# Patient Record
Sex: Male | Born: 1970 | Race: White | Hispanic: No | Marital: Single | Smoking: Never smoker
Health system: Southern US, Community
[De-identification: ages and names within clinical notes are randomized; demographics above are authoritative.]

## PROBLEM LIST (undated history)

## (undated) DIAGNOSIS — I10 Essential (primary) hypertension: Secondary | ICD-10-CM

## (undated) DIAGNOSIS — N400 Enlarged prostate without lower urinary tract symptoms: Secondary | ICD-10-CM

## (undated) DIAGNOSIS — I517 Cardiomegaly: Secondary | ICD-10-CM

## (undated) HISTORY — PX: ARTERY REPAIR: SHX559

## (undated) HISTORY — PX: IMPACTED THIRD MOLAR REMOVAL: SHX1790

---

## 2002-11-11 HISTORY — PX: OTHER SURGICAL HISTORY: SHX169

## 2006-06-09 ENCOUNTER — Emergency Department: Payer: Self-pay | Admitting: Emergency Medicine

## 2006-06-09 ENCOUNTER — Other Ambulatory Visit: Payer: Self-pay

## 2007-02-06 ENCOUNTER — Emergency Department: Payer: Self-pay | Admitting: Emergency Medicine

## 2007-06-15 IMAGING — CR DG CHEST 1V PORT
1 series · 1 of 1 positions shown · non-contrast
Comparison: none

REASON FOR EXAM: Chest pain           rm 5
COMMENTS:

PROCEDURE:     DXR - DXR PORTABLE CHEST SINGLE VIEW  - June 09, 2006  [DATE]
RESULT:     AP view of the chest shows the lung fields to be clear.  The
heart, mediastinal, and osseous structures are normal in appearance.
Monitoring electrodes are present.

[view not recorded]
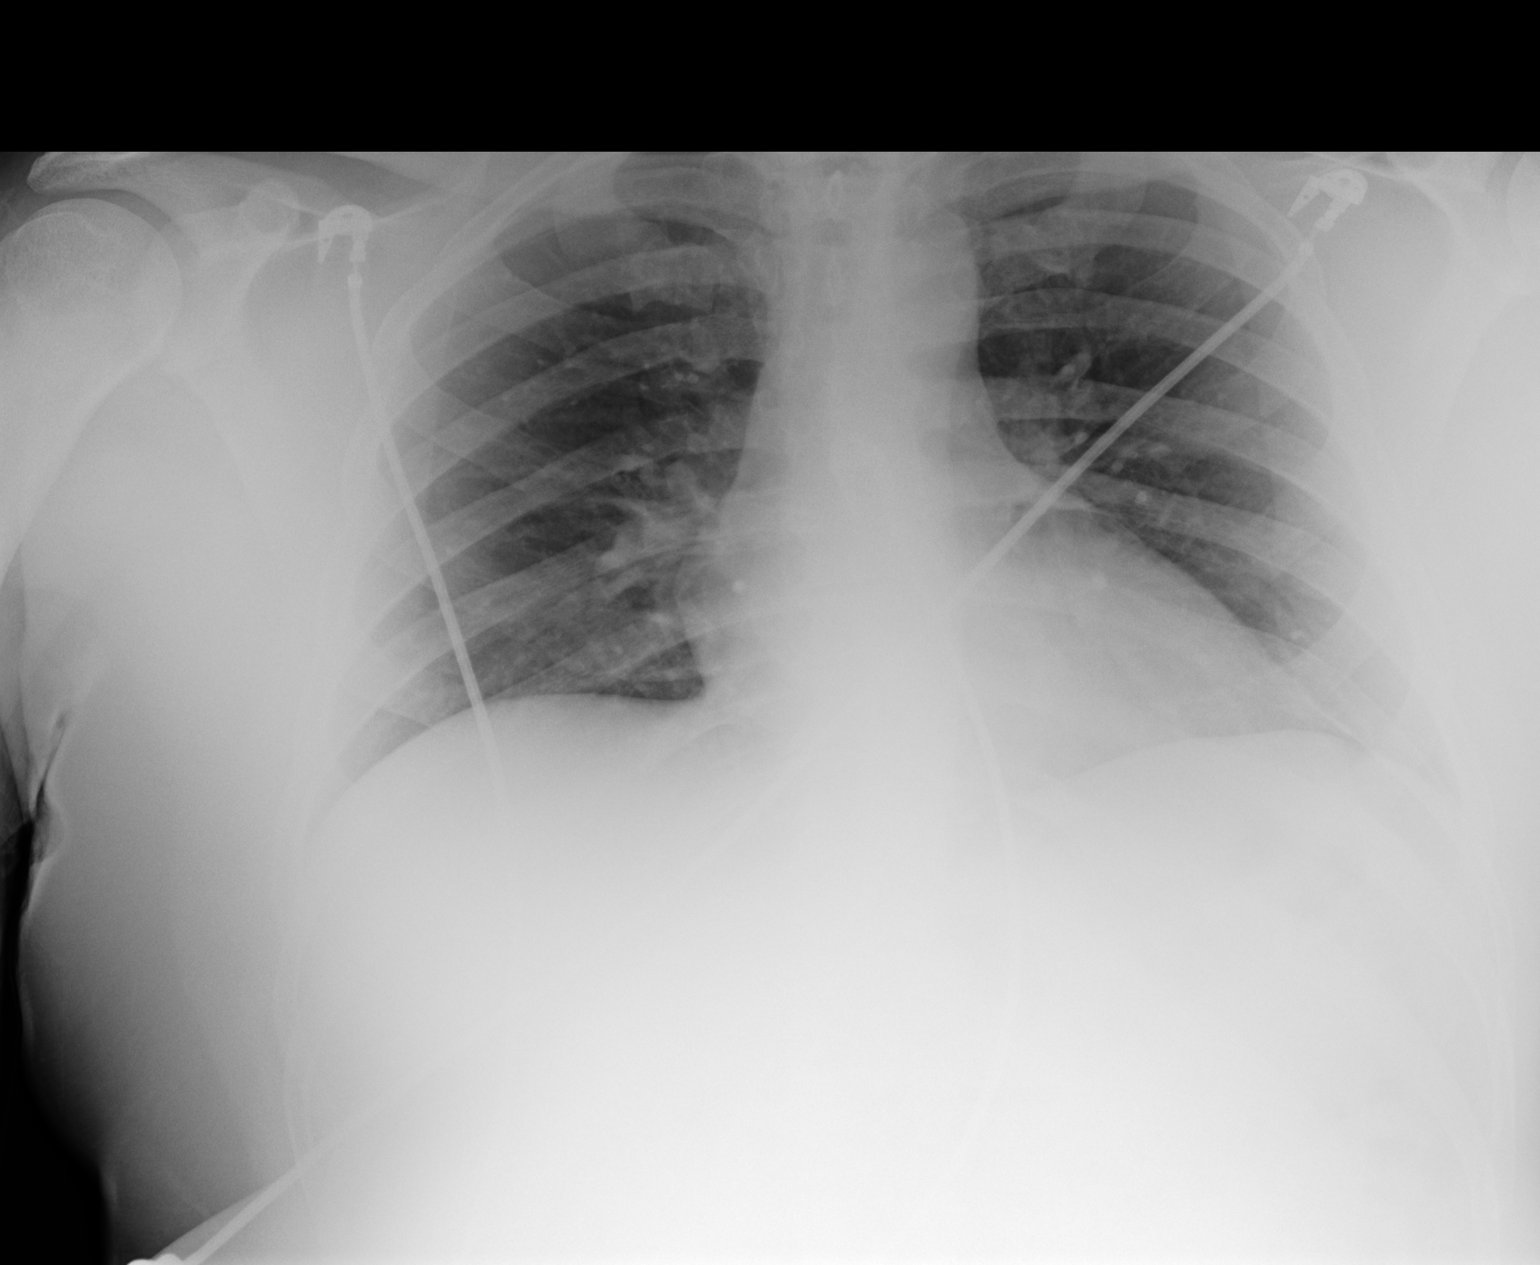

[1 of 1 positions shown; findings below may reference images not displayed]

IMPRESSION: No acute changes are identified.

## 2011-07-30 ENCOUNTER — Ambulatory Visit: Payer: Self-pay

## 2011-11-29 DIAGNOSIS — E669 Obesity, unspecified: Secondary | ICD-10-CM | POA: Insufficient documentation

## 2011-11-29 DIAGNOSIS — N400 Enlarged prostate without lower urinary tract symptoms: Secondary | ICD-10-CM | POA: Insufficient documentation

## 2012-03-12 ENCOUNTER — Ambulatory Visit: Payer: Self-pay | Admitting: Family Medicine

## 2012-03-12 LAB — CBC WITH DIFFERENTIAL/PLATELET
Basophil #: 0.1 10*3/uL (ref 0.0–0.1)
Basophil %: 0.8 %
Eosinophil #: 0.3 10*3/uL (ref 0.0–0.7)
HCT: 42.6 % (ref 40.0–52.0)
Lymphocyte #: 3.5 10*3/uL (ref 1.0–3.6)
MCH: 29.1 pg (ref 26.0–34.0)
Monocyte #: 1.1 x10 3/mm — ABNORMAL HIGH (ref 0.2–1.0)
Neutrophil #: 4.3 10*3/uL (ref 1.4–6.5)
Neutrophil %: 45.8 %
RBC: 4.83 10*6/uL (ref 4.40–5.90)
WBC: 9.4 10*3/uL (ref 3.8–10.6)

## 2012-07-11 ENCOUNTER — Ambulatory Visit: Payer: Self-pay | Admitting: Medical

## 2013-08-14 ENCOUNTER — Ambulatory Visit: Payer: Self-pay | Admitting: Family Medicine

## 2013-08-14 LAB — RAPID STREP-A WITH REFLX: Micro Text Report: POSITIVE

## 2013-10-15 ENCOUNTER — Ambulatory Visit: Payer: Self-pay | Admitting: Family Medicine

## 2013-10-15 LAB — URINALYSIS, COMPLETE
Bilirubin,UR: NEGATIVE
Glucose,UR: NEGATIVE mg/dL (ref 0–75)
Ketone: NEGATIVE
Nitrite: NEGATIVE
Ph: 6.5 (ref 4.5–8.0)
Protein: NEGATIVE
Specific Gravity: 1.015 (ref 1.003–1.030)
Squamous Epithelial: NONE SEEN
WBC UR: >30 /HPF (ref 0–5)

## 2013-10-15 LAB — GC/CHLAMYDIA PROBE AMP

## 2013-10-15 LAB — RAPID INFLUENZA A&B ANTIGENS

## 2013-10-17 LAB — URINE CULTURE

## 2013-10-21 DIAGNOSIS — I517 Cardiomegaly: Secondary | ICD-10-CM | POA: Insufficient documentation

## 2014-10-21 IMAGING — CR DG CHEST 2V
1 series · 3 of 3 positions shown · non-contrast
Comparison: 06/09/2006

CLINICAL DATA: Cough, fever, fatigue

EXAM:
CHEST  2 VIEW

[Series 1: pa · 0.17mm/px · 3 of 3 slices shown]
[im 1/3]
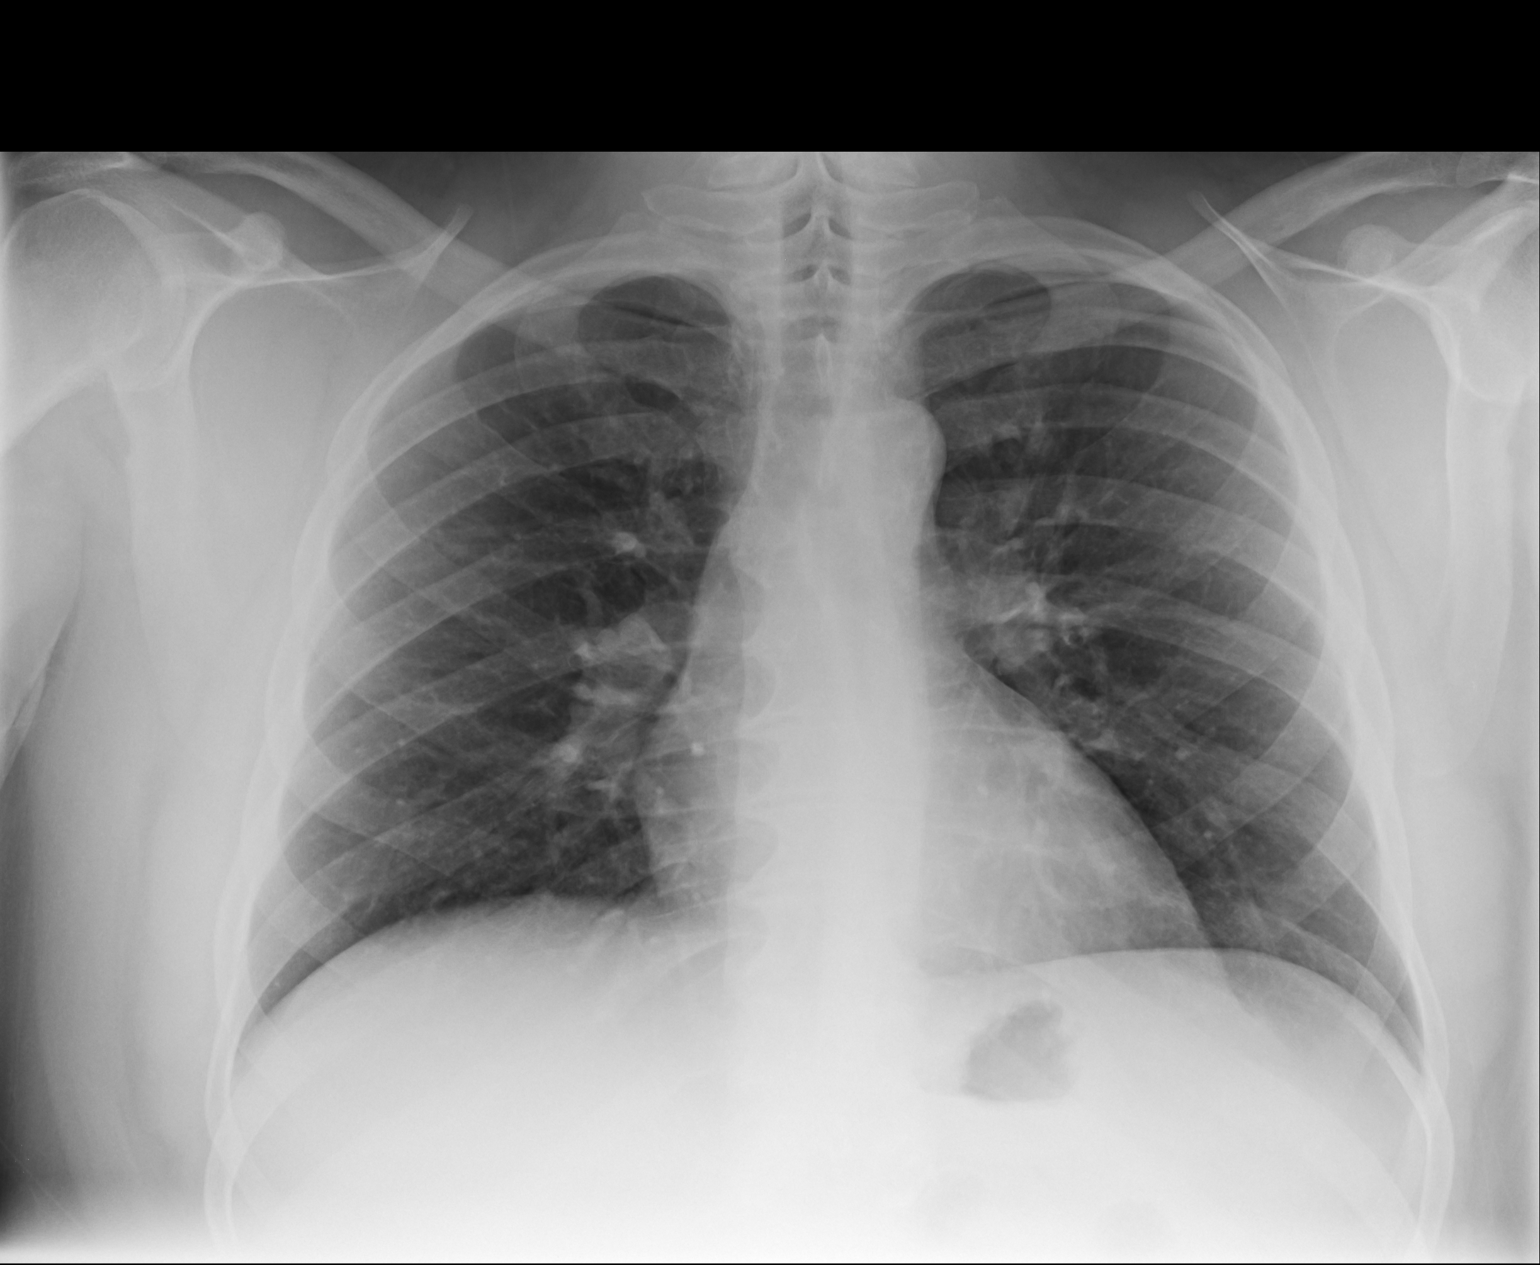
[im 2/3]
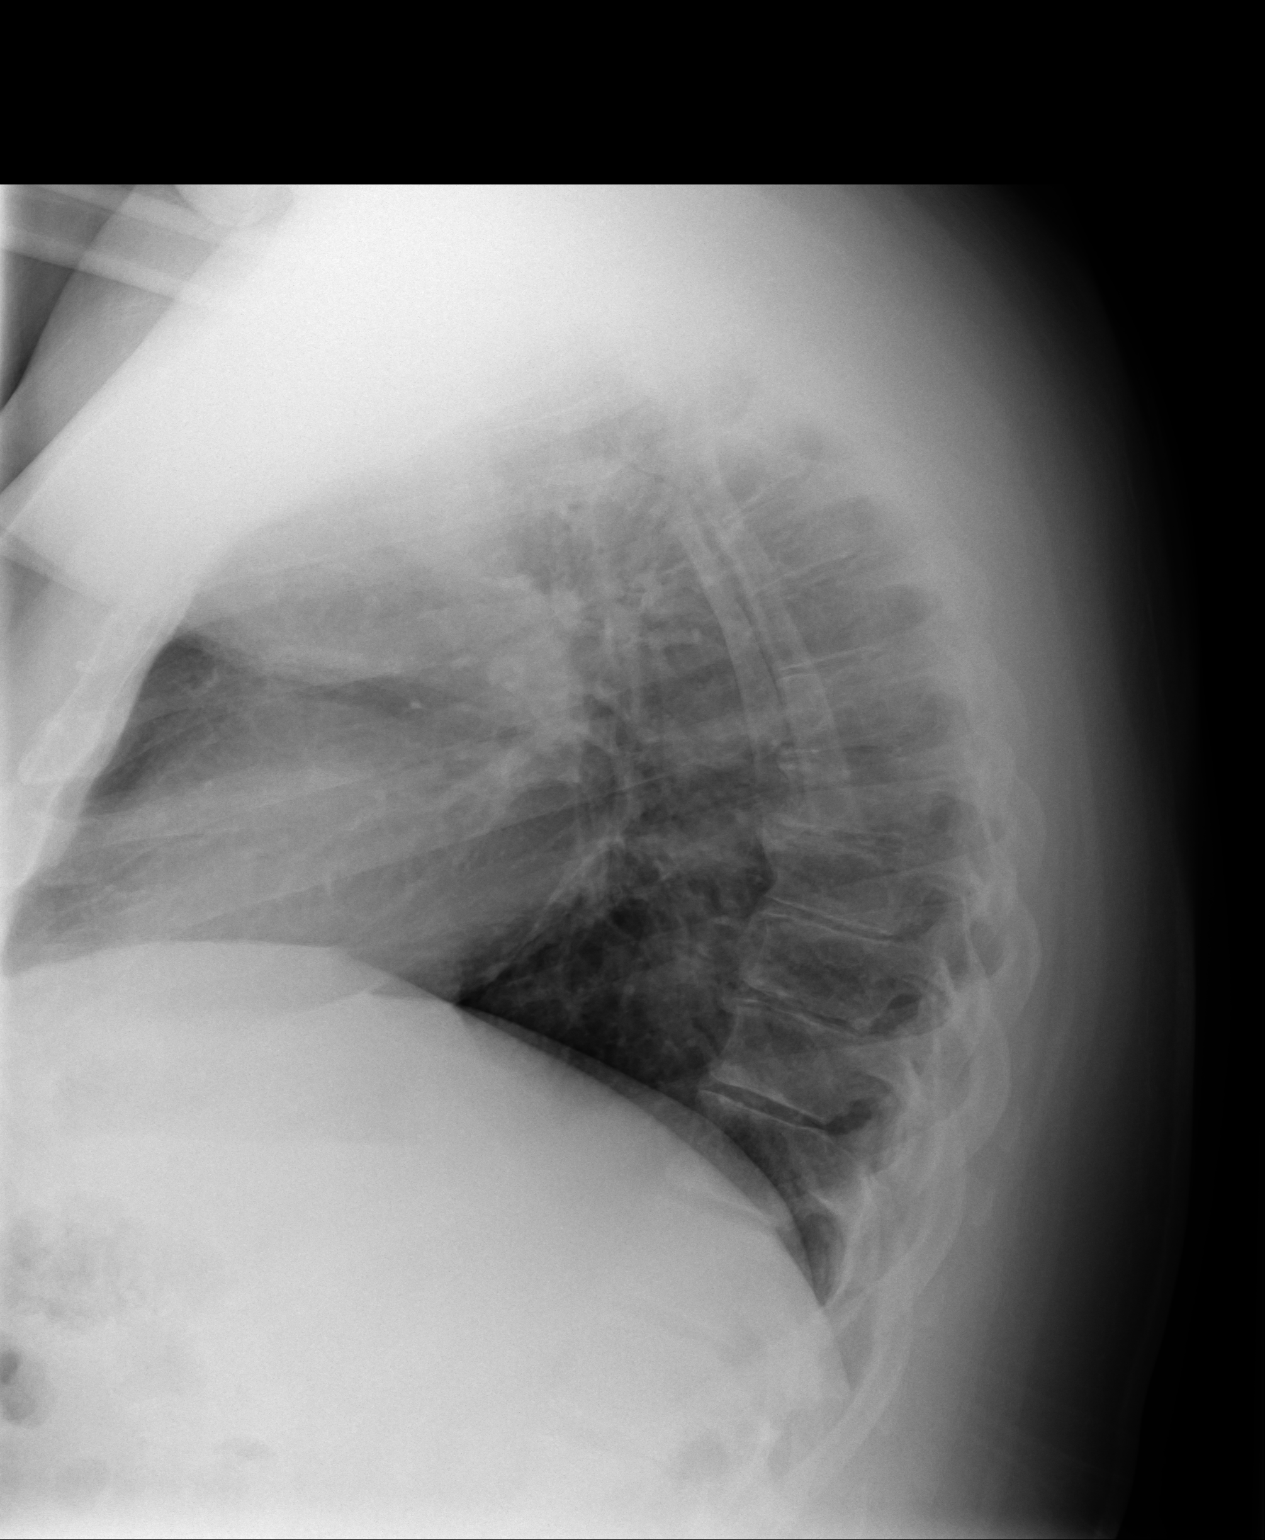
[im 3/3]
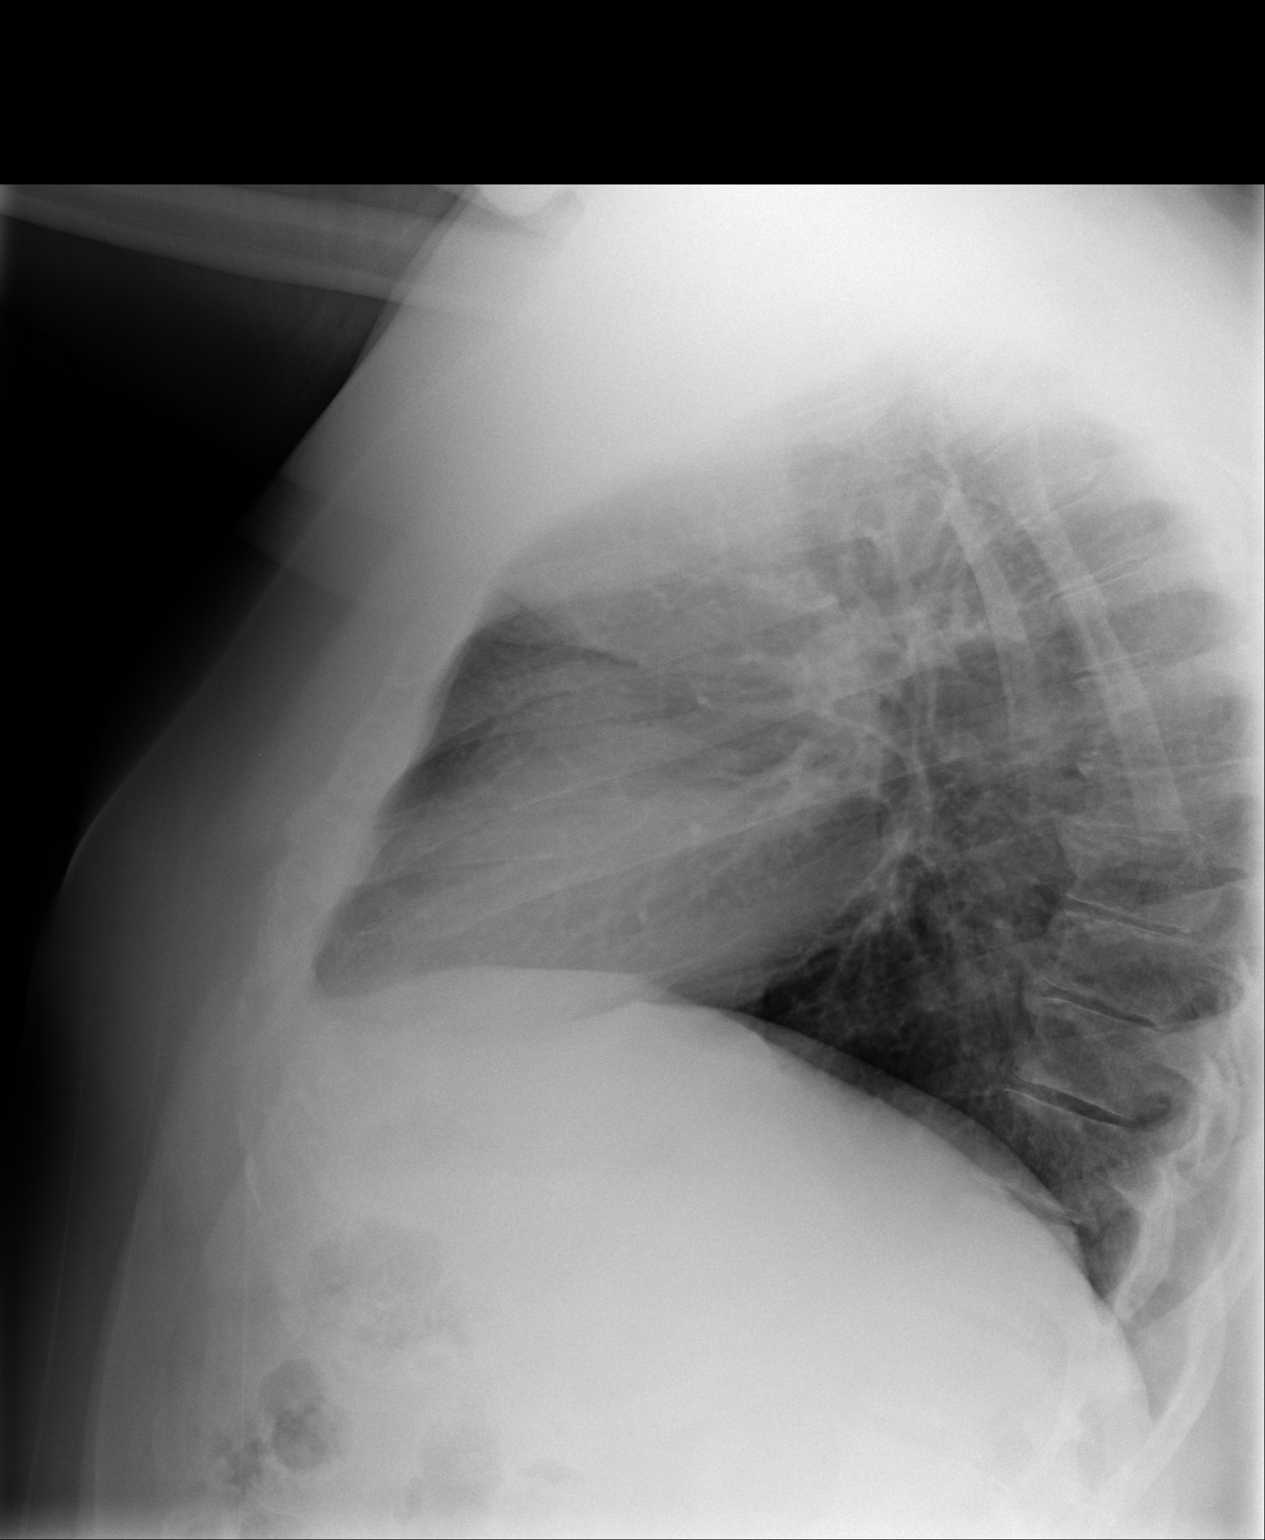

[3 of 3 positions shown; findings below may reference images not displayed]

FINDINGS: The heart size and mediastinal contours are within normal limits.
Both lungs are clear. The visualized skeletal structures are
unremarkable. Degenerative changes noted of the spine
IMPRESSION: No active cardiopulmonary disease.

## 2016-01-27 ENCOUNTER — Encounter: Payer: Self-pay | Admitting: Gynecology

## 2016-01-27 ENCOUNTER — Ambulatory Visit
Admission: EM | Admit: 2016-01-27 | Discharge: 2016-01-27 | Disposition: A | Discharge status: Home / Self Care | Payer: BC Managed Care – PPO | Attending: Family Medicine | Admitting: Family Medicine

## 2016-01-27 DIAGNOSIS — B349 Viral infection, unspecified: Secondary | ICD-10-CM

## 2016-01-27 HISTORY — DX: Cardiomegaly: I51.7

## 2016-01-27 HISTORY — DX: Benign prostatic hyperplasia without lower urinary tract symptoms: N40.0

## 2016-01-27 HISTORY — DX: Essential (primary) hypertension: I10

## 2016-01-27 LAB — RAPID INFLUENZA A&B ANTIGENS
Influenza A (ARMC): NEGATIVE
Influenza B (ARMC): NEGATIVE

## 2016-01-27 MED ORDER — HYDROCOD POLST-CPM POLST ER 10-8 MG/5ML PO SUER: 5.0000 mL | Freq: Every evening | ORAL | Status: DC | PRN

## 2016-01-27 MED ORDER — BENZONATATE 100 MG PO CAPS: 100.0000 mg | ORAL_CAPSULE | Freq: Three times a day (TID) | ORAL | Status: DC | PRN

## 2016-01-27 MED ORDER — HYDROCOD POLST-CPM POLST ER 10-8 MG/5ML PO SUER: 5.0000 mL | Freq: Two times a day (BID) | ORAL | Status: DC | PRN

## 2016-01-27 NOTE — Discharge Instructions (Signed)
Take medication as prescribed. Rest. Drink plenty of fluids. Take over the counter tylenol or ibuprofen as needed.  ° °Follow up with your primary care physician this week as needed. Return to Urgent care for new or worsening concerns.  °Viral Infections °A viral infection can be caused by different types of viruses. Most viral infections are not serious and resolve on their own. However, some infections may cause severe symptoms and may lead to further complications. °SYMPTOMS °Viruses can frequently cause: °· Minor sore throat. °· Aches and pains. °· Headaches. °· Runny nose. °· Different types of rashes. °· Watery eyes. °· Tiredness. °· Cough. °· Loss of appetite. °· Gastrointestinal infections, resulting in nausea, vomiting, and diarrhea. °These symptoms do not respond to antibiotics because the infection is not caused by bacteria. However, you might catch a bacterial infection following the viral infection. This is sometimes called a "superinfection." Symptoms of such a bacterial infection may include: °· Worsening sore throat with pus and difficulty swallowing. °· Swollen neck glands. °· Chills and a high or persistent fever. °· Severe headache. °· Tenderness over the sinuses. °· Persistent overall ill feeling (malaise), muscle aches, and tiredness (fatigue). °· Persistent cough. °· Yellow, green, or brown mucus production with coughing. °HOME CARE INSTRUCTIONS  °· Only take over-the-counter or prescription medicines for pain, discomfort, diarrhea, or fever as directed by your caregiver. °· Drink enough water and fluids to keep your urine clear or pale yellow. Sports drinks can provide valuable electrolytes, sugars, and hydration. °· Get plenty of rest and maintain proper nutrition. Soups and broths with crackers or rice are fine. °SEEK IMMEDIATE MEDICAL CARE IF:  °· You have severe headaches, shortness of breath, chest pain, neck pain, or an unusual rash. °· You have uncontrolled vomiting, diarrhea, or you  are unable to keep down fluids. °· You or your child has an oral temperature above 102° F (38.9° C), not controlled by medicine. °· Your baby is older than 3 months with a rectal temperature of 102° F (38.9° C) or higher. °· Your baby is 3 months old or younger with a rectal temperature of 100.4° F (38° C) or higher. °MAKE SURE YOU:  °· Understand these instructions. °· Will watch your condition. °· Will get help right away if you are not doing well or get worse. °  °This information is not intended to replace advice given to you by your health care provider. Make sure you discuss any questions you have with your health care provider. °  °Document Released: 08/07/2005 Document Revised: 01/20/2012 Document Reviewed: 04/05/2015 °Elsevier Interactive Patient Education ©2016 [employer] ° °

## 2016-01-27 NOTE — ED Provider Notes (Signed)
[facility]  ____________________________________________  Time seen: Approximately 11:06 AM  I have reviewed the triage vital signs and the nursing notes.   HISTORY  Chief Complaint Cough  HPI George Harrington is a 45 y.o. male  presents with complaints of 3 days of runny nose, nasal congestion, body aches with intermittent chills. Patient states intermittent fever. Patient reports continues to eat and drink well. Patient reports that he does work at [employer] and frequently exposed to sick people. Denies sore throat. Denies home sick contacts. States cough occasionally keeps him up at night. States cough is a dry hacking cough. States occasionally has taken some over-the-counter cough and congestion medication.  Denies chest pain, shortness of breath, wheezing, weakness, nausea, vomiting, diarrhea, dizziness, vision changes, rash, neck pain, back pain, extremity pain, extremity swelling or abdominal pain.   Past Medical History  Diagnosis Date  . LVH (left ventricular hypertrophy)   . BPH (benign prostatic hyperplasia)   . Hypertension     There are no active problems to display for this patient.   Past Surgical History  Procedure Laterality Date  . Impacted third molar removal    . Artery repair Left     Current Outpatient Rx  Name  Route  Sig  Dispense  Refill  . hydrochlorothiazide (MICROZIDE) 12.5 MG capsule   Oral   Take 12.5 mg by mouth daily.         . tamsulosin (FLOMAX) 0.4 MG CAPS capsule   Oral   Take 0.4 mg by mouth.           Allergies Review of patient's allergies indicates no known allergies.  No family history on file.  Social History Social History  Substance Use Topics  . Smoking status: Never Smoker   . Smokeless tobacco: None  . Alcohol Use: Yes    Review of Systems Constitutional: As above. Eyes: No visual changes. ENT: No sore throat. Positive runny nose, nasal congestion and cough. Cardiovascular: Denies chest  pain. Respiratory: Denies shortness of breath. Gastrointestinal: No abdominal pain.  No nausea, no vomiting.  No diarrhea.  No constipation. Genitourinary: Negative for dysuria. Musculoskeletal: Negative for back pain. Skin: Negative for rash. Neurological: Negative for headaches, focal weakness or numbness.  10-point ROS otherwise negative.  ____________________________________________   PHYSICAL EXAM:  VITAL SIGNS: ED Triage Vitals  Enc Vitals Group     BP 01/27/16 1032 120/84 mmHg     Pulse Rate 01/27/16 1032 116 Recheck 96     Resp 01/27/16 1032 18     Temp 01/27/16 1032 99 F (37.2 C)     Temp Source 01/27/16 1032 Oral     SpO2 01/27/16 1032 98 %     Weight 01/27/16 1032 240 lb (108.863 kg)     Height 01/27/16 1032 5\' 7"  (1.702 m)     Head Cir --      Peak Flow --      Pain Score 01/27/16 1036 4     Pain Loc --      Pain Edu? --      Excl. in GC? --   Constitutional: Alert and oriented. Well appearing and in no acute distress. Eyes: Conjunctivae are normal. PERRL. EOMI. Head: Atraumatic. No sinus tenderness to palpation. No swelling. No erythema.  Ears: no erythema, normal TMs bilaterally.   Nose:Nasal congestion with clear rhinorrhea  Mouth/Throat: Mucous membranes are moist. No pharyngeal erythema. No tonsillar swelling or exudate.  Neck: No stridor.  No cervical spine tenderness  to palpation. Hematological/Lymphatic/Immunilogical: No cervical lymphadenopathy. Cardiovascular: Normal rate, regular rhythm. Grossly normal heart sounds.  Good peripheral circulation. Respiratory: Normal respiratory effort.  No retractions. Lungs CTAB.No wheezes, rales or rhonchi. Good air movement.  Gastrointestinal: Soft and nontender. Normal Bowel sounds. No CVA tenderness. Musculoskeletal: No lower or upper extremity tenderness nor edema. No cervical, thoracic or lumbar tenderness to palpation. Neurologic:  Normal speech and language. No gross focal neurologic deficits are  appreciated. No gait instability. Skin:  Skin is warm, dry and intact. No rash noted. Psychiatric: Mood and affect are normal. Speech and behavior are normal.  ____________________________________________   LABS (all labs ordered are listed, but only abnormal results are displayed)  Labs Reviewed  RAPID INFLUENZA A&B ANTIGENS ([facility] ONLY)     INITIAL IMPRESSION / ASSESSMENT AND PLAN / ED COURSE  Pertinent labs & imaging results that were available during my care of the patient were reviewed by me and considered in my medical decision making (see chart for details).  Well-appearing patient. No acute distress. Wife at bedside. Presents for complaints of 3 days of runny nose, nasal congestion, cough, chills. Lungs clear throughout. Abdomen soft and nontender. Moist mucous membranes. Suspect viral illness. Will evaluate for influenza.  Influenza negative. Symptoms greater than 48 hours. Discussed with patient and spouse treatment options. Patient spouse states that the cough is the biggest complaint as intermittently wakes him and her up at night. Will treat supportively and symptomatically. Will treat with when necessary At night Tussionex as well as Tessalon Perles during the day. Encouraged rest, fluids, over-the-counter Tylenol or ibuprofen as needed. Encourage PCP follow up.  Discussed follow up with Primary care physician this week. Discussed follow up and return parameters including no resolution or any worsening concerns. Patient verbalized understanding and agreed to plan.   ____________________________________________   FINAL CLINICAL IMPRESSION(S) / ED DIAGNOSES  Final diagnoses:  Viral illness      Note: This dictation was prepared with Dragon dictation along with smaller phrase technology. Any transcriptional errors that result from this process are unintentional.    Renford Dills, NP 01/27/16 1539

## 2016-01-27 NOTE — ED Notes (Signed)
Patient c/o x 3 days cough / body ache and chills.

## 2016-04-01 ENCOUNTER — Ambulatory Visit: Payer: Self-pay | Admitting: Urology

## 2016-04-03 ENCOUNTER — Ambulatory Visit (INDEPENDENT_AMBULATORY_CARE_PROVIDER_SITE_OTHER): Payer: BC Managed Care – PPO | Admitting: Urology

## 2016-04-03 ENCOUNTER — Encounter: Payer: Self-pay | Admitting: Urology

## 2016-04-03 VITALS — BP 130/92 | HR 101 | Ht 68.0 in | Wt 252.2 lb

## 2016-04-03 DIAGNOSIS — N401 Enlarged prostate with lower urinary tract symptoms: Secondary | ICD-10-CM

## 2016-04-03 DIAGNOSIS — Z125 Encounter for screening for malignant neoplasm of prostate: Secondary | ICD-10-CM

## 2016-04-03 DIAGNOSIS — N138 Other obstructive and reflux uropathy: Secondary | ICD-10-CM

## 2016-04-03 DIAGNOSIS — I10 Essential (primary) hypertension: Secondary | ICD-10-CM | POA: Insufficient documentation

## 2016-04-03 NOTE — Progress Notes (Signed)
04/03/2016 3:45 PM   George Harrington [birth year] [id number]  Referring provider: No referring provider defined for this encounter.  Chief Complaint  Patient presents with  . New Patient (Initial Visit)    BPH, difficulty urinating     HPI: Patient is a 45 year old Caucasian male who is referred to [place] by Titus Mould, NP for a PSA of 1.63 ng/mL on 03/11/2016.    According to the [facility] PSA screening algorithm, PSA's that result >1.5 in men 40-49 need a referral to an urologist.   Patient has been on tamsulosin for the last four years.  He has been bothered by nocturia x 1 and leakage of urine since his 20's.  This PSA was drawn during a routine physical exam.  Patient does not have a family history of prostate cancer.  His IPSS score is 7/1.  He has not had gross hematuria, dysuria or suprapubic pain.  He does not have a history of recurrent UTI's.  He has not had any recent fevers, chills, nausea or vomiting.        IPSS      04/03/16 1500       International Prostate Symptom Score   How often have you had the sensation of not emptying your bladder? Less than 1 in 5     How often have you had to urinate less than every two hours? Less than half the time     How often have you found you stopped and started again several times when you urinated? Not at All     How often have you found it difficult to postpone urination? Not at All     How often have you had a weak urinary stream? Not at All     How often have you had to strain to start urination? Not at All     How many times did you typically get up at night to urinate? 4 Times     Total IPSS Score 7     Quality of Life due to urinary symptoms   If you were to spend the rest of your life with your urinary condition just the way it is now how would you feel about that? Pleased        Score:  1-7 Mild 8-19 Moderate 20-35 Severe   PMH: Past Medical History  Diagnosis Date  . LVH (left  ventricular hypertrophy)   . BPH (benign prostatic hyperplasia)   . Hypertension     Surgical History: Past Surgical History  Procedure Laterality Date  . Impacted third molar removal    . Artery repair Left   . Arm surgery Left 2004    Home Medications:    Medication List       This list is accurate as of: 04/03/16  3:45 PM.  Always use your most recent med list.               hydrochlorothiazide 25 MG tablet  Commonly known as:  HYDRODIURIL  TAKE 1 TABLET (25 MG TOTAL) BY MOUTH ONCE DAILY.     tamsulosin 0.4 MG Caps capsule  Commonly known as:  FLOMAX  Take 0.4 mg by mouth.        Allergies: No Known Allergies  Family History: Family History  Problem Relation Age of Onset  . Hematuria Neg Hx   . Kidney cancer Neg Hx   . Prostate cancer Neg Hx     Social History:  reports that he has never smoked. He does not have any smokeless tobacco history on file. He reports that he drinks alcohol. He reports that he does not use illicit drugs.  ROS: UROLOGY Frequent Urination?: No Hard to postpone urination?: No Burning/pain with urination?: No Get up at night to urinate?: Yes Leakage of urine?: Yes Urine stream starts and stops?: No Trouble starting stream?: No Do you have to strain to urinate?: No Blood in urine?: No Urinary tract infection?: No Sexually transmitted disease?: No Injury to kidneys or bladder?: No Painful intercourse?: No Weak stream?: No Erection problems?: No Penile pain?: No  Gastrointestinal Nausea?: No Vomiting?: No Indigestion/heartburn?: No Diarrhea?: No Constipation?: No  Constitutional Fever: No Night sweats?: No Weight loss?: No Fatigue?: No  Skin Skin rash/lesions?: No Itching?: No  Eyes Blurred vision?: No Double vision?: No  Ears/Nose/Throat Sore throat?: No Sinus problems?: No  Hematologic/Lymphatic Swollen glands?: No Easy bruising?: No  Cardiovascular Leg swelling?: No Chest pain?:  No  Respiratory Cough?: No Shortness of breath?: No  Endocrine Excessive thirst?: No  Musculoskeletal Back pain?: No Joint pain?: Yes  Neurological Headaches?: No Dizziness?: No  Psychologic Depression?: No Anxiety?: No  Physical Exam: BP 130/92 mmHg  Pulse 101  Ht 5\' 8"  (1.727 m)  Wt 252 lb 3.2 oz (114.397 kg)  BMI 38.36 kg/m2  Constitutional: Well nourished. Alert and oriented, No acute distress. HEENT: [place] AT, moist mucus membranes. Trachea midline, no masses. Cardiovascular: No clubbing, cyanosis, or edema. Respiratory: Normal respiratory effort, no increased work of breathing. GI: Abdomen is soft, non tender, non distended, no abdominal masses. Liver and spleen not palpable.  No hernias appreciated.  Stool sample for occult testing is not indicated.   GU: No CVA tenderness.  No bladder fullness or masses.  Patient with circumcised phallus.   Urethral meatus is patent.  No penile discharge. No penile lesions or rashes. Scrotum without lesions, cysts, rashes and/or edema.  Testicles are located scrotally bilaterally. No masses are appreciated in the testicles. Left and right epididymis are normal. Rectal: Patient with  normal sphincter tone. Anus and perineum without scarring or rashes. No rectal masses are appreciated. Prostate is approximately 45 grams, no nodules are appreciated. Seminal vesicles are normal. Skin: No rashes, bruises or suspicious lesions. Lymph: No cervical or inguinal adenopathy. Neurologic: Grossly intact, no focal deficits, moving all 4 extremities. Psychiatric: Normal mood and affect.  Laboratory Data: Lab Results  Component Value Date   WBC 9.4 03/12/2012   HGB 14.1 03/12/2012   HCT 42.6 03/12/2012   MCV 88 03/12/2012   PLT 228 03/12/2012     Assessment & Plan:    1.  BPH with LUTS:   -IPSS score is 7/1     -Continue tamsulosin 0.4 mg daily as prescribed by PCP    -PSA  2. Screening PSA:   Patient was found to have a PSA of 1.63  ng/mL and according to the [facility] PSA screening algorithm, a referral to an urologist is recommended at that time.    NCCN Guidelines recommend that persons age 645-75 with a normal DRE and a PSA value of 1-3 ng/mL continue screenings every 1-2 years.  If the PSA returns > 3 ng/mL or the DRE is abnormal, a biopsy is recommended at that time.    The AUA Guidelines do not recommend PSA screening in men ages 4440 to 2654 with average risk.  They recommend starting at age 47 for African American males and those men  with a positive family history of prostate cancer.    I will repeat the PSA today, if it is unchanged in value or lower, I believe it is appropriate to continue following the patient on a yearly basis unless the PSA becomes > 3 or the DRE is abnormal.    Return for follow up pending labs.  These notes generated with voice recognition software. I apologize for typographical errors.  Michiel Cowboy, PA-C  [facility] [address] [place], [place] [address] [phone]

## 2016-04-04 LAB — PSA: PROSTATE SPECIFIC AG, SERUM: 1.6 ng/mL (ref 0.0–4.0)

## 2016-04-05 ENCOUNTER — Telehealth: Payer: Self-pay

## 2016-04-05 NOTE — Telephone Encounter (Signed)
-----   Message from Harle BattiestShannon A McGowan, PA-C sent at 04/04/2016  4:27 PM EDT ----- PSA remains the same.  National [facility] [employer] [facility] recommends screening every 1 to 2 years until age 47 and then it is yearly.  If the PSA reaches 3, he will need a referral to an Urologist.  We can continue the screening if he would like, but if his PCP is comfortable, they can continue the screenings.

## 2016-04-05 NOTE — Telephone Encounter (Signed)
Spoke with pt in reference to PSA testing. Pt voiced understanding.

## 2016-04-10 ENCOUNTER — Telehealth: Payer: Self-pay | Admitting: Urology

## 2016-04-10 NOTE — Telephone Encounter (Signed)
Would you please send a copy of this office visit to the patient's referring provider, Titus MouldElizabeth White Burney, NP.

## 2016-04-11 NOTE — Telephone Encounter (Signed)
done

## 2016-12-24 ENCOUNTER — Other Ambulatory Visit: Payer: Self-pay

## 2017-11-11 HISTORY — PX: ACHILLES TENDON REPAIR: SUR1153

## 2019-05-17 ENCOUNTER — Other Ambulatory Visit: Payer: Self-pay

## 2019-05-17 ENCOUNTER — Telehealth: Payer: Self-pay | Admitting: *Deleted

## 2019-05-17 ENCOUNTER — Ambulatory Visit: Payer: Managed Care, Other (non HMO) | Admitting: Adult Health

## 2019-05-17 ENCOUNTER — Encounter: Payer: Self-pay | Admitting: Adult Health

## 2019-05-17 DIAGNOSIS — Z7189 Other specified counseling: Secondary | ICD-10-CM | POA: Diagnosis not present

## 2019-05-17 DIAGNOSIS — Z20828 Contact with and (suspected) exposure to other viral communicable diseases: Secondary | ICD-10-CM

## 2019-05-17 DIAGNOSIS — Z20822 Contact with and (suspected) exposure to covid-19: Secondary | ICD-10-CM

## 2019-05-17 NOTE — Progress Notes (Addendum)
Virtual Visit via Telephone Note  I connected with George Harrington on 05/17/19 at 11:45 AM EDT by telephone and verified that I am speaking with the correct person using two identifiers.  Location: Patient: at  Home   Provider: [facility], [place], [place]     I discussed the limitations, risks, security and privacy concerns of performing an evaluation and management service by telephone and the availability of in person appointments. I also discussed with the patient that there may be a patient responsible charge related to this service. The patient expressed understanding and agreed to proceed.   History of Present Illness: Patient is a 48 year old male in no acute distress because the clinic for virtual visit, he had an exposure to COVID-19 positive patient on 05/12/2019. He reports he is asymptomatic.  His was not wearing a mask at the time.  Patient  denies any fever, body aches,chills, rash, chest pain, shortness of breath, nausea, vomiting, or diarrhea.   He reports he was tested at [facility] on 05/16/19 and his test resulted this morning to COVID-19. He would like to proceed with a second test turned to work earlier than his quarantine.   Observations/Objective: Afebrile patient reports.  No other vitals available.   Patient is alert and oriented and responsive to questions Engages in conversation with provider. Speaks in full sentences without any pauses without any shortness of breath or distress.    Assessment and Plan:    ICD-10-CM   1. Educated About Covid-19 Virus Infection  Z71.89   2. Close Exposure to 2019 Novel Coronavirus  Z20.828      Follow Up Instructions:   He will call the office should he develop any symptoms, he is discussed red flags of COVID-19 and when to seek emergency medical treatment.  He has given a return to work note. I discussed the assessment and treatment plan with the patient. The patient was provided an  opportunity to ask questions and all were answered. The patient agreed with the plan and demonstrated an understanding of the instructions.   The patient was advised to call back or seek an in-person evaluation if the symptoms worsen or if the condition fails to improve as anticipated.  I provided 15 minutes of non-face-to-face time during this encounter.   Marcille Buffy, FNP

## 2019-05-17 NOTE — Patient Instructions (Signed)
Person Under Monitoring Name: George Harrington  Location: [address] [place] [address]   Infection Prevention Recommendations for Individuals Confirmed to have, or Being Evaluated for, 2019 Novel Coronavirus (COVID-19) Infection Who Receive Care at Home  Individuals who are confirmed to have, or are being evaluated for, COVID-19 should follow the prevention steps below until a healthcare provider or local or state health department says they can return to normal activities.  Stay home except to get medical care You should restrict activities outside your home, except for getting medical care. Do not go to work, school, or public areas, and do not use public transportation or taxis.  Call ahead before visiting your doctor Before your medical appointment, call the healthcare provider and tell them that you have, or are being evaluated for, COVID-19 infection. This will help the healthcare provider's office take steps to keep other people from getting infected. Ask your healthcare provider to call the local or state health department.  Monitor your symptoms Seek prompt medical attention if your illness is worsening (e.g., difficulty breathing). Before going to your medical appointment, call the healthcare provider and tell them that you have, or are being evaluated for, COVID-19 infection. Ask your healthcare provider to call the local or state health department.  Wear a facemask You should wear a facemask that covers your nose and mouth when you are in the same room with other people and when you visit a healthcare provider. People who live with or visit you should also wear a facemask while they are in the same room with you.  Separate yourself from other people in your home As much as possible, you should stay in a different room from other people in your home. Also, you should use a separate bathroom, if available.  Avoid sharing household items You should not  share dishes, drinking glasses, cups, eating utensils, towels, bedding, or other items with other people in your home. After using these items, you should wash them thoroughly with soap and water.  Cover your coughs and sneezes Cover your mouth and nose with a tissue when you cough or sneeze, or you can cough or sneeze into your sleeve. Throw used tissues in a lined trash can, and immediately wash your hands with soap and water for at least 20 seconds or use an alcohol-based hand rub.  Wash your [facility] your hands often and thoroughly with soap and water for at least 20 seconds. You can use an alcohol-based hand sanitizer if soap and water are not available and if your hands are not visibly dirty. Avoid touching your eyes, nose, and mouth with unwashed hands.   Prevention Steps for Caregivers and Household Members of Individuals Confirmed to have, or Being Evaluated for, COVID-19 Infection Being Cared for in the Home  If you live with, or provide care at home for, a person confirmed to have, or being evaluated for, COVID-19 infection please follow these guidelines to prevent infection:  Follow healthcare provider's instructions Make sure that you understand and can help the patient follow any healthcare provider instructions for all care.  Provide for the patient's basic needs You should help the patient with basic needs in the home and provide support for getting groceries, prescriptions, and other personal needs.  Monitor the patient's symptoms If they are getting sicker, call his or her medical provider and tell them that the patient has, or is being evaluated for, COVID-19 infection. This will help the healthcare provider's office  take steps to keep other people from getting infected. Ask the healthcare provider to call the local or state health department.  Limit the number of people who have contact with the patient  If possible, have only one caregiver for the  patient.  Other household members should stay in another home or place of residence. If this is not possible, they should stay  in another room, or be separated from the patient as much as possible. Use a separate bathroom, if available.  Restrict visitors who do not have an essential need to be in the home.  Keep older adults, very young children, and other sick people away from the patient Keep older adults, very young children, and those who have compromised immune systems or chronic health conditions away from the patient. This includes people with chronic heart, lung, or kidney conditions, diabetes, and cancer.  Ensure good ventilation Make sure that shared spaces in the home have good air flow, such as from an air conditioner or an opened window, weather permitting.  Wash your hands often  Wash your hands often and thoroughly with soap and water for at least 20 seconds. You can use an alcohol based hand sanitizer if soap and water are not available and if your hands are not visibly dirty.  Avoid touching your eyes, nose, and mouth with unwashed hands.  Use disposable paper towels to dry your hands. If not available, use dedicated cloth towels and replace them when they become wet.  Wear a facemask and gloves  Wear a disposable facemask at all times in the room and gloves when you touch or have contact with the patient's blood, body fluids, and/or secretions or excretions, such as sweat, saliva, sputum, nasal mucus, vomit, urine, or feces.  Ensure the mask fits over your nose and mouth tightly, and do not touch it during use.  Throw out disposable facemasks and gloves after using them. Do not reuse.  Wash your hands immediately after removing your facemask and gloves.  If your personal clothing becomes contaminated, carefully remove clothing and launder. Wash your hands after handling contaminated clothing.  Place all used disposable facemasks, gloves, and other waste in a lined  container before disposing them with other household waste.  Remove gloves and wash your hands immediately after handling these items.  Do not share dishes, glasses, or other household items with the patient  Avoid sharing household items. You should not share dishes, drinking glasses, cups, eating utensils, towels, bedding, or other items with a patient who is confirmed to have, or being evaluated for, COVID-19 infection.  After the person uses these items, you should wash them thoroughly with soap and water.  Wash laundry thoroughly  Immediately remove and wash clothes or bedding that have blood, body fluids, and/or secretions or excretions, such as sweat, saliva, sputum, nasal mucus, vomit, urine, or feces, on them.  Wear gloves when handling laundry from the patient.  Read and follow directions on labels of laundry or clothing items and detergent. In general, wash and dry with the warmest temperatures recommended on the label.  Clean all areas the individual has used often  Clean all touchable surfaces, such as counters, tabletops, doorknobs, bathroom fixtures, toilets, phones, keyboards, tablets, and bedside tables, every day. Also, clean any surfaces that may have blood, body fluids, and/or secretions or excretions on them.  Wear gloves when cleaning surfaces the patient has come in contact with.  Use a diluted bleach solution (e.g., dilute bleach with 1 part  bleach and 10 parts water) or a household disinfectant with a label that says EPA-registered for coronaviruses. To make a bleach solution at home, add 1 tablespoon of bleach to 1 quart (4 cups) of water. For a larger supply, add  cup of bleach to 1 gallon (16 cups) of water.  Read labels of cleaning products and follow recommendations provided on product labels. Labels contain instructions for safe and effective use of the cleaning product including precautions you should take when applying the product, such as wearing gloves or  eye protection and making sure you have good ventilation during use of the product.  Remove gloves and wash hands immediately after cleaning.  Monitor yourself for signs and symptoms of illness Caregivers and household members are considered close contacts, should monitor their health, and will be asked to limit movement outside of the home to the extent possible. Follow the monitoring steps for close contacts listed on the symptom monitoring form.   ? If you have additional questions, contact your local health department or call the epidemiologist on call at [phone] (available 24/7). ? This guidance is subject to change. For the most up-to-date guidance from [facility], please refer to their website: http://www.martin.com/https://www.cdc.gov/coronavirus/2019-ncov/hcp/guidance-prevent-spread.htmlCOVID-19 Frequently Asked Questions COVID-19 (coronavirus disease) is an infection that is caused by a large family of viruses. Some viruses cause illness in people and others cause illness in animals like camels, cats, and bats. In some cases, the viruses that cause illness in animals can spread to humans. Where did the coronavirus come from? In December 2019, Armeniahina told the [employer] [facility](WHO) of several cases of lung disease (human respiratory illness). These cases were linked to an open seafood and [place]. The link to the seafood and livestock market suggests that the virus may have spread from animals to humans. However, since that first outbreak in December, the virus has also been shown to spread from person to person. What is the name of the disease and the virus? Disease name Early on, this disease was called novel coronavirus. This is because scientists determined that the disease was caused by a new (novel) respiratory virus. The World Health Organization [facility](WHO) has now named the disease COVID-19, or coronavirus disease. Virus name The virus that causes the disease is called  severe acute respiratory syndrome coronavirus 2 (SARS-CoV-2). More information on disease and virus naming World Health Organization [address] [employer](WHO): www.who.int/emergencies/diseases/novel-coronavirus-2019/technical-guidance/naming-the-coronavirus-disease-(covid-2019)-and-the-virus-that-causes-it Who is at risk for complications from coronavirus disease? Some people may be at higher risk for complications from coronavirus disease. This includes older adults and people who have chronic diseases, such as heart disease, diabetes, and lung disease. If you are at higher risk for complications, take these extra precautions:  Avoid close contact with people who are sick or have a fever or cough. Stay at least 3-6 ft (1-2 m) away from them, if possible.  Wash your hands often with soap and water for at least 20 seconds.  Avoid touching your face, mouth, nose, or eyes.  Keep supplies on hand at home, such as food, medicine, and cleaning supplies.  Stay home as much as possible.  Avoid social gatherings and travel. How does coronavirus disease spread? The virus that causes coronavirus disease spreads easily from person to person (is contagious). There are also cases of community-spread disease. This means the disease has spread to:  People who have no known contact with other infected people.  People who have not traveled to areas where there are known cases.  It appears to spread from one person to another through droplets from coughing or sneezing. Can I get the virus from touching surfaces or objects? There is still a lot that we do not know about the virus that causes coronavirus disease. Scientists are basing a lot of information on what they know about similar viruses, such as:  Viruses cannot generally survive on surfaces for long. They need a human body (host) to survive.  It is more likely that the virus is spread by close contact with people who are sick (direct contact), such as  through: ? Shaking hands or hugging. ? Breathing in respiratory droplets that travel through the air. This can happen when an infected person coughs or sneezes on or near other people.  It is less likely that the virus is spread when a person touches a surface or object that has the virus on it (indirect contact). The virus may be able to enter the body if the person touches a surface or object and then touches his or her face, eyes, nose, or mouth. Can a person spread the virus without having symptoms of the disease? It may be possible for the virus to spread before a person has symptoms of the disease, but this is most likely not the main way the virus is spreading. It is more likely for the virus to spread by being in close contact with people who are sick and breathing in the respiratory droplets of a sick person's cough or sneeze. What are the symptoms of coronavirus disease? Symptoms vary from person to person and can range from mild to severe. Symptoms may include:  Fever.  Cough.  Tiredness, weakness, or fatigue.  Fast breathing or feeling short of breath. These symptoms can appear anywhere from 2 to 14 days after you have been exposed to the virus. If you develop symptoms, call your health care provider. People with severe symptoms may need hospital care. If I am exposed to the virus, how long does it take before symptoms start? Symptoms of coronavirus disease may appear anywhere from 2 to 14 days after a person has been exposed to the virus. If you develop symptoms, call your health care provider. Should I be tested for this virus? Your health care provider will decide whether to test you based on your symptoms, history of exposure, and your risk factors. How does a health care provider test for this virus? Health care providers will collect samples to send for testing. Samples may include:  Taking a swab of fluid from the nose.  Taking fluid from the lungs by having you cough up  mucus (sputum) into a sterile cup.  Taking a blood sample.  Taking a stool or urine sample. Is there a treatment or vaccine for this virus? Currently, there is no vaccine to prevent coronavirus disease. Also, there are no medicines like antibiotics or antivirals to treat the virus. A person who becomes sick is given supportive care, which means rest and fluids. A person may also relieve his or her symptoms by using over-the-counter medicines that treat sneezing, coughing, and runny nose. These are the same medicines that a person takes for the common cold. If you develop symptoms, call your health care provider. People with severe symptoms may need hospital care. What can I do to protect myself and my family from this virus?     You can protect yourself and your family by taking the same actions that you would take to prevent the spread of  other viruses. Take the following actions:  Wash your hands often with soap and water for at least 20 seconds. If soap and water are not available, use alcohol-based hand sanitizer.  Avoid touching your face, mouth, nose, or eyes.  Cough or sneeze into a tissue, sleeve, or elbow. Do not cough or sneeze into your hand or the air. ? If you cough or sneeze into a tissue, throw it away immediately and wash your hands.  Disinfect objects and surfaces that you frequently touch every day.  Avoid close contact with people who are sick or have a fever or cough. Stay at least 3-6 ft (1-2 m) away from them, if possible.  Stay home if you are sick, except to get medical care. Call your health care provider before you get medical care.  Make sure your vaccines are up to date. Ask your health care provider what vaccines you need. What should I do if I need to travel? Follow travel recommendations from your local health authority, the CDC, and WHO. Travel information and advice  Centers for Disease Control and Prevention (CDC):  BodyEditor.hu  World Health Organization [facility]): ThirdIncome.ca Know the risks and take action to protect your health  You are at higher risk of getting coronavirus disease if you are traveling to areas with an outbreak or if you are exposed to travelers from areas with an outbreak.  Wash your hands often and practice good hygiene to lower the risk of catching or spreading the virus. What should I do if I am sick? General instructions to stop the spread of infection  Wash your hands often with soap and water for at least 20 seconds. If soap and water are not available, use alcohol-based hand sanitizer.  Cough or sneeze into a tissue, sleeve, or elbow. Do not cough or sneeze into your hand or the air.  If you cough or sneeze into a tissue, throw it away immediately and wash your hands.  Stay home unless you must get medical care. Call your health care provider or local health authority before you get medical care.  Avoid public areas. Do not take public transportation, if possible.  If you can, wear a mask if you must go out of the house or if you are in close contact with someone who is not sick. Keep your home clean  Disinfect objects and surfaces that are frequently touched every day. This may include: ? Counters and tables. ? Doorknobs and light switches. ? Sinks and faucets. ? Electronics such as phones, remote controls, keyboards, computers, and tablets.  Wash dishes in hot, soapy water or use a dishwasher. Air-dry your dishes.  Wash laundry in hot water. Prevent infecting other household members  Let healthy household members care for children and pets, if possible. If you have to care for children or pets, wash your hands often and wear a mask.  Sleep in a different bedroom or bed, if possible.  Do not share personal items, such as razors, toothbrushes, deodorant, combs,  brushes, towels, and washcloths. Where to find more information Centers for Disease Control and Prevention (CDC)  Information and news updates: https://www.butler-gonzalez.com/ World Health Organization [facility])  Information and news updates: MissExecutive.com.ee  Coronavirus health topic: https://www.castaneda.info/  Questions and answers on COVID-19: OpportunityDebt.at  Global tracker: who.sprinklr.com American Academy of Pediatrics (AAP)  Information for families: www.healthychildren.org/English/health-issues/conditions/chest-lungs/Pages/2019-Novel-Coronavirus.aspx The coronavirus situation is changing rapidly. Check your local health authority website or the CDC and [facility] websites for updates and news. When should I  contact a health care provider?  Contact your health care provider if you have symptoms of an infection, such as fever or cough, and you: ? Have been near anyone who is known to have coronavirus disease. ? Have come into contact with a person who is suspected to have coronavirus disease. ? Have traveled outside of the country. When should I get emergency medical care?  Get help right away by calling your local emergency services (911 in the [place] if you have: ? Trouble breathing. ? Pain or pressure in your chest. ? Confusion. ? Blue-tinged lips and fingernails. ? Difficulty waking from sleep. ? Symptoms that get worse. Let the emergency medical personnel know if you think you have coronavirus disease. Summary  A new respiratory virus is spreading from person to person and causing COVID-19 (coronavirus disease).  The virus that causes COVID-19 appears to spread easily. It spreads from one person to another through droplets from coughing or sneezing.  Older adults and those with chronic diseases are at higher risk of disease. If you are at higher risk for complications, take extra  precautions.  There is currently no vaccine to prevent coronavirus disease. There are no medicines, such as antibiotics or antivirals, to treat the virus.  You can protect yourself and your family by washing your hands often, avoiding touching your face, and covering your coughs and sneezes. This information is not intended to replace advice given to you by your health care provider. Make sure you discuss any questions you have with your health care provider. Document Released: 02/23/2019 Document Revised: 02/23/2019 Document Reviewed: 02/23/2019 Elsevier Patient Education  [place].

## 2019-05-17 NOTE — Telephone Encounter (Signed)
Pt scheduled for covid testing 05/18/19 @ 11:30 @ [facility]. Instructions given and order placed

## 2019-05-17 NOTE — Telephone Encounter (Signed)
-----   Message from Doreen Beam, [place] sent at 05/17/2019 12:17 PM EDT ----- Needs testing 24 hours after he had 1st test so cannot be tested until 05/18/19 Tuesday.  He will like to ne tested at [facility].  He has been exposed to covid positive patient.

## 2019-05-18 ENCOUNTER — Other Ambulatory Visit: Payer: Self-pay

## 2019-05-18 DIAGNOSIS — Z20822 Contact with and (suspected) exposure to covid-19: Secondary | ICD-10-CM

## 2019-05-22 LAB — NOVEL CORONAVIRUS, NAA: SARS-CoV-2, NAA: NOT DETECTED

## 2020-11-08 ENCOUNTER — Telehealth: Payer: Managed Care, Other (non HMO) | Admitting: Physician Assistant

## 2020-11-08 DIAGNOSIS — J069 Acute upper respiratory infection, unspecified: Secondary | ICD-10-CM | POA: Diagnosis not present

## 2020-11-08 MED ORDER — AZITHROMYCIN 250 MG PO TABS
ORAL_TABLET | ORAL | 0 refills | Status: DC
Start: 2020-11-08 — End: 2022-02-22

## 2020-11-08 MED ORDER — FLUTICASONE PROPIONATE 50 MCG/ACT NA SUSP: 2.0000 | Freq: Every day | NASAL | 6 refills | Status: DC

## 2020-11-08 NOTE — Patient Instructions (Addendum)
We discussed the importance of good hydration, using a mask, and washing hands frequently. Discussed viral vs bacterial infection. We discussed continued use of over the counter cold medication. Will add Flonase nasal spray and Zithromax (2 tabs day one, and 1 tab daily until all taken). Discussed retesting for COVID in 4 or 5 days. Please see your primary MD or return to this clinic if not improving.  Upper Respiratory Infection, Adult An upper respiratory infection (URI) affects the nose, throat, and upper air passages. URIs are caused by germs (viruses). The most common type of URI is often called "the common cold." Medicines cannot cure URIs, but you can do things at home to relieve your symptoms. URIs usually get better within 7-10 days. Follow these instructions at home: Activity  Rest as needed.  If you have a fever, stay home from work or school until your fever is gone, or until your doctor says you may return to work or school. ? You should stay home until you cannot spread the infection anymore (you are not contagious). ? Your doctor may have you wear a face mask so you have less risk of spreading the infection. Relieving symptoms  Gargle with a salt-water mixture 3-4 times a day or as needed. To make a salt-water mixture, completely dissolve -1 tsp of salt in 1 cup of warm water.  Use a cool-mist humidifier to add moisture to the air. This can help you breathe more easily. Eating and drinking   Drink enough fluid to keep your pee (urine) pale yellow.  Eat soups and other clear broths. General instructions   Take over-the-counter and prescription medicines only as told by your doctor. These include cold medicines, fever reducers, and cough suppressants.  Do not use any products that contain nicotine or tobacco. These include cigarettes and e-cigarettes. If you need help quitting, ask your doctor.  Avoid being where people are smoking (avoid secondhand smoke).  Make sure  you get regular shots and get the flu shot every year.  Keep all follow-up visits as told by your doctor. This is important. How to avoid spreading infection to others   Wash your hands often with soap and water. If you do not have soap and water, use hand sanitizer.  Avoid touching your mouth, face, eyes, or nose.  Cough or sneeze into a tissue or your sleeve or elbow. Do not cough or sneeze into your hand or into the air. Contact a doctor if:  You are getting worse, not better.  You have any of these: ? A fever. ? Chills. ? Brown or red mucus in your nose. ? Yellow or brown fluid (discharge)coming from your nose. ? Pain in your face, especially when you bend forward. ? Swollen neck glands. ? Pain with swallowing. ? White areas in the back of your throat. Get help right away if:  You have shortness of breath that gets worse.  You have very bad or constant: ? Headache. ? Ear pain. ? Pain in your forehead, behind your eyes, and over your cheekbones (sinus pain). ? Chest pain.  You have long-lasting (chronic) lung disease along with any of these: ? Wheezing. ? Long-lasting cough. ? Coughing up blood. ? A change in your usual mucus.  You have a stiff neck.  You have changes in your: ? Vision. ? Hearing. ? Thinking. ? Mood. Summary  An upper respiratory infection (URI) is caused by a germ called a virus. The most common type of URI is often  called "the common cold."  URIs usually get better within 7-10 days.  Take over-the-counter and prescription medicines only as told by your doctor. This information is not intended to replace advice given to you by your health care provider. Make sure you discuss any questions you have with your health care provider. Document Revised: 11/05/2018 Document Reviewed: 06/20/2017 Elsevier Patient Education  2020 [employer].

## 2020-11-08 NOTE — Progress Notes (Signed)
Pt gives permission for virtual telephone visit. Visit was 10 min..  Subjective:    Patient ID: Waynard Reeds, male    DOB: [birth year], 49 y.o.   MRN: [id number]  URI  This is a new problem. The current episode started in the past 7 days. The problem has been gradually worsening. There has been no fever. Associated symptoms include congestion and rhinorrhea. Pertinent negatives include no abdominal pain, chest pain, coughing, diarrhea, headaches, nausea, neck pain, rash, sore throat, vomiting or wheezing. Treatments tried: OTC cold medications. The treatment provided mild relief.      Review of Systems  Constitutional: Negative for activity change, appetite change, chills, diaphoresis, fatigue and fever.  HENT: Positive for congestion, postnasal drip, rhinorrhea and sinus pressure. Negative for sore throat.   Respiratory: Negative for cough and wheezing.   Cardiovascular: Negative for chest pain.  Gastrointestinal: Negative for abdominal pain, diarrhea, nausea and vomiting.  Musculoskeletal: Negative for neck pain.  Skin: Negative for rash.  Neurological: Negative for headaches.       Objective:   Physical Exam Constitutional:      Comments: Virtual telephone visit           Assessment & Plan:   Dx: Upper Respiratory Infection.  Discussed the need for hydration, using a mask and washing hands frequently. Discussed viral vs bacterial infection. Pt has had COVID vac., but no flu vac.Marland Kitchen Discussed continued use of OTC mediction. Will add flonase and Zithromax. Pt encouraged to retest for covid in 4 or 5 days. See PCP or return to this clinic if not improving.

## 2021-07-23 ENCOUNTER — Emergency Department: Payer: Managed Care, Other (non HMO)

## 2021-07-23 ENCOUNTER — Other Ambulatory Visit: Payer: Self-pay

## 2021-07-23 ENCOUNTER — Emergency Department
Admission: EM | Admit: 2021-07-23 | Discharge: 2021-07-23 | Disposition: A | Discharge status: Home / Self Care | Payer: Managed Care, Other (non HMO) | Attending: Student in an Organized Health Care Education/Training Program | Admitting: Student in an Organized Health Care Education/Training Program

## 2021-07-23 DIAGNOSIS — Z79899 Other long term (current) drug therapy: Secondary | ICD-10-CM | POA: Insufficient documentation

## 2021-07-23 DIAGNOSIS — R0789 Other chest pain: Secondary | ICD-10-CM | POA: Diagnosis not present

## 2021-07-23 DIAGNOSIS — Z20822 Contact with and (suspected) exposure to covid-19: Secondary | ICD-10-CM | POA: Insufficient documentation

## 2021-07-23 DIAGNOSIS — I10 Essential (primary) hypertension: Secondary | ICD-10-CM | POA: Insufficient documentation

## 2021-07-23 LAB — BASIC METABOLIC PANEL
Anion gap: 9 (ref 5–15)
BUN: 20 mg/dL (ref 6–20)
CO2: 27 mmol/L (ref 22–32)
Calcium: 9.1 mg/dL (ref 8.9–10.3)
Chloride: 103 mmol/L (ref 98–111)
Creatinine, Ser: 0.94 mg/dL (ref 0.61–1.24)
GFR, Estimated: >60 mL/min (ref >60)
Glucose, Bld: 106 mg/dL — ABNORMAL HIGH (ref 70–99)
Potassium: 3.8 mmol/L (ref 3.5–5.1)
Sodium: 139 mmol/L (ref 135–145)

## 2021-07-23 LAB — RESP PANEL BY RT-PCR (FLU A&B, COVID) ARPGX2
Influenza A by PCR: NEGATIVE
Influenza B by PCR: NEGATIVE
SARS Coronavirus 2 by RT PCR: NEGATIVE

## 2021-07-23 LAB — CBC
HCT: 47.5 % (ref 39.0–52.0)
Hemoglobin: 15.6 g/dL (ref 13.0–17.0)
MCH: 29.1 pg (ref 26.0–34.0)
MCHC: 32.8 g/dL (ref 30.0–36.0)
MCV: 88.6 fL (ref 80.0–100.0)
Platelets: 237 10*3/uL (ref 150–400)
RBC: 5.36 MIL/uL (ref 4.22–5.81)
RDW: 13.5 % (ref 11.5–15.5)
WBC: 8.5 10*3/uL (ref 4.0–10.5)
nRBC: 0 % (ref 0.0–0.2)

## 2021-07-23 LAB — TROPONIN I (HIGH SENSITIVITY)
Troponin I (High Sensitivity): 4 ng/L (ref <18)
Troponin I (High Sensitivity): 4 ng/L (ref <18)

## 2021-07-23 LAB — D-DIMER, QUANTITATIVE: D-Dimer, Quant: 0.54 ug/mL-FEU — ABNORMAL HIGH (ref 0.00–0.50)

## 2021-07-23 MED ORDER — IOHEXOL 350 MG/ML SOLN
75.0000 mL | Freq: Once | INTRAVENOUS | Status: AC | PRN
  Administered 2021-07-23: 75 mL via INTRAVENOUS
  Filled 2021-07-23: qty 75

## 2021-07-23 MED ORDER — KETOROLAC TROMETHAMINE 30 MG/ML IJ SOLN
15.0000 mg | Freq: Once | INTRAMUSCULAR | Status: AC
  Administered 2021-07-23: 15 mg via INTRAVENOUS
  Filled 2021-07-23: qty 1

## 2021-07-23 NOTE — ED Triage Notes (Signed)
Pt referred from PCP for chest pain that radiates into the back between the shoulder blades with HTN over the past couple of days, pt is in NAD , ambulatory on arrival with a steady gait.

## 2021-07-23 NOTE — ED Provider Notes (Signed)
[facility] Emergency Department Provider Note    Event Date/Time   First MD Initiated Contact with Patient 07/23/21 1308     (approximate)  I have reviewed the triage vital signs and the nursing notes.   HISTORY  Chief Complaint Chest Pain    HPI BOB DAVERSA is a 50 y.o. male below listed past medical history presents to the ER for concern for some chest discomfort radiating to his neck and his right shoulder blade.  Denies any associated nausea or vomiting.  States he has been under some stress.  Had similar presentation last year at [facility] and was told that he had reflux.  Denies any abdominal pain.  He is also been around some people that he thinks may have COVID.  Denies any cough.  Symptoms started yesterday morning.  No associated diaphoresis.  Pain is mild and not ripping or tearing through to his back.  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 Authorizing Provider  amLODipine (NORVASC) 10 MG tablet Take 10 mg by mouth daily. 04/30/21  Yes [provider]  ibuprofen (ADVIL) 400 MG tablet Take 400 mg by mouth as needed.   Yes [provider]  tamsulosin (FLOMAX) 0.4 MG CAPS capsule Take 0.4 mg by mouth.   Yes [provider]  azithromycin (ZITHROMAX) 250 MG tablet 2 tabs day one, then 1 tab  daily Patient not taking: Reported on 07/23/2021 11/08/20   Ivery Quale, PA-C  fluticasone [facility]) 50 MCG/ACT nasal spray Place 2 sprays into both nostrils daily. Patient not taking: Reported on 07/23/2021 11/08/20   Ivery Quale, PA-C    Allergies Lisinopril    Social History Social History   Tobacco Use   Smoking status: Never   Smokeless tobacco: Never  Substance Use Topics   Alcohol use: Yes   Drug use: No    Review of Systems Patient denies headaches, rhinorrhea, blurry vision, numbness, shortness of breath, chest pain, edema, cough, abdominal pain, nausea, vomiting, diarrhea, dysuria, fevers, rashes or hallucinations unless otherwise stated above in HPI. ____________________________________________   PHYSICAL EXAM:  VITAL SIGNS: Vitals:   07/23/21 1117  BP: (!) 139/98  Pulse: 79  Resp: 18  Temp: 97.8 F (36.6 C)  SpO2: 97%    Constitutional: Alert and oriented.  Eyes: Conjunctivae are normal.  Head: Atraumatic. Nose: No congestion/rhinnorhea. Mouth/Throat: Mucous membranes are moist.   Neck: No stridor. Painless ROM.  Cardiovascular: Normal rate, regular rhythm. Grossly normal heart sounds.  Good peripheral circulation. Respiratory: Normal respiratory effort.  No retractions. Lungs CTAB. Gastrointestinal: Soft and nontender. No distention. No abdominal bruits. No CVA tenderness. Genitourinary:  Musculoskeletal: No lower extremity tenderness nor edema.  No joint effusions. Neurologic:  Normal speech and language. No  gross focal neurologic deficits are appreciated. No facial droop Skin:  Skin is warm, dry and intact. No rash noted. Psychiatric: Mood and affect are normal. Speech and behavior are normal.  ____________________________________________   LABS (all labs ordered are listed, but only abnormal results are displayed)  Results for orders placed or performed during the hospital encounter of 07/23/21 (from the past 24 hour(s))  Basic  metabolic panel     Status: Abnormal   Collection Time: 07/23/21 11:12 AM  Result Value Ref Range   Sodium 139 135 - 145 mmol/L   Potassium 3.8 3.5 - 5.1 mmol/L   Chloride 103 98 - 111 mmol/L   CO2 27 22 - 32 mmol/L   Glucose, Bld 106 (H) 70 - 99 mg/dL   BUN 20 6 - 20 mg/dL   Creatinine, Ser 2.59 0.61 - 1.24 mg/dL   Calcium 9.1 8.9 - 56.3 mg/dL   GFR, Estimated >87 >56 mL/min   Anion gap 9 5 - 15  CBC     Status: None   Collection Time: 07/23/21 11:12 AM  Result Value Ref Range   WBC 8.5 4.0 - 10.5 K/uL   RBC 5.36 4.22 - 5.81 MIL/uL   Hemoglobin 15.6 13.0 - 17.0 g/dL   HCT 43.3 29.5 - 18.8 %   MCV 88.6 80.0 - 100.0 fL   MCH 29.1 26.0 - 34.0 pg   MCHC 32.8 30.0 - 36.0 g/dL   RDW 41.6 60.6 - 30.1 %   Platelets 237 150 - 400 K/uL   nRBC 0.0 0.0 - 0.2 %  Troponin I (High Sensitivity)     Status: None   Collection Time: 07/23/21 11:12 AM  Result Value Ref Range   Troponin I (High Sensitivity) 4 <18 ng/L  Troponin I (High Sensitivity)     Status: None   Collection Time: 07/23/21  1:50 PM  Result Value Ref Range   Troponin I (High Sensitivity) 4 <18 ng/L  D-dimer, quantitative     Status: Abnormal   Collection Time: 07/23/21  1:50 PM  Result Value Ref Range   D-Dimer, Quant 0.54 (H) 0.00 - 0.50 ug/mL-FEU  Resp Panel by RT-PCR (Flu A&B, Covid) Nasopharyngeal Swab     Status: None   Collection Time: 07/23/21  1:50 PM   Specimen: Nasopharyngeal Swab; Nasopharyngeal(NP) swabs in vial transport medium  Result Value Ref Range   SARS Coronavirus 2 by RT PCR NEGATIVE NEGATIVE   Influenza A by PCR NEGATIVE NEGATIVE   Influenza B by PCR NEGATIVE NEGATIVE   ____________________________________________  EKG My review and personal interpretation at Time: 11:15   Indication: chest pain  Rate: 85  Rhythm: sinus Axis: normal Other: normal intervals, no stemi, no depressions ____________________________________________  RADIOLOGY  I personally reviewed all radiographic images ordered  to evaluate for the above acute complaints and reviewed radiology reports and findings.  These findings were personally discussed with the patient.  Please see medical record for radiology report.  ____________________________________________   PROCEDURES  Procedure(s) performed:  Procedures    Critical Care performed: no ____________________________________________   INITIAL IMPRESSION / ASSESSMENT AND PLAN / ED COURSE  Pertinent labs & imaging results that were available during my care of the patient were reviewed by me and considered in my medical decision making (see chart for details).   DDX: ACS, pericarditis, esophagitis, pe, dissection, pna, bronchitis, costochondritis   TAYLER LASSEN is a 50 y.o. who presents to the ED with presentation as described above he is afebrile  hemodynamically stable well-appearing in no acute distress.  EKG is normal.  Initial troponin negative.  Will add on D-dimer as he does have some pleuritic discomfort further stratify otherwise low risk by Wells criteria.  D-dimer is mildly elevated therefore will order CTA.  His abdominal exam is soft and benign.  Will test for COVID.  Will order serial enzymes.  Clinical Course as of 07/23/21 1555  Mon Jul 23, 2021  1551 Patient reassessed.  Feels well.  Exam and work-up is reassuring.  Does admit to being under quite a bit of stress for the past few weeks both at work and as he is also coaching high school football.  Given reassuring work-up I do believe he stable and appropriate for outpatient follow-up.  Patient agreeable to plan. [PR]    Clinical Course User Index [PR] Willy Eddy, MD    The patient was evaluated in Emergency Department today for the symptoms described in the history of present illness. He/she was evaluated in the context of the global COVID-19 pandemic, which necessitated consideration that the patient might be at risk for infection with the SARS-CoV-2 virus that causes  COVID-19. Institutional protocols and algorithms that pertain to the evaluation of patients at risk for COVID-19 are in a state of rapid change based on information released by regulatory bodies including the CDC and federal and state organizations. These policies and algorithms were followed during the patient's care in the ED.  As part of my medical decision making, I reviewed the following data within the electronic MEDICAL RECORD NUMBER Nursing notes reviewed and incorporated, Labs reviewed, notes from prior ED visits and [place] Controlled Substance Database   ____________________________________________   FINAL CLINICAL IMPRESSION(S) / ED DIAGNOSES  Final diagnoses:  Atypical chest pain      NEW MEDICATIONS STARTED DURING THIS VISIT:  New Prescriptions   No medications on file     Note:  This document was prepared using Dragon voice recognition software and may include unintentional dictation errors.    Willy Eddy, MD 07/23/21 [id number]

## 2022-02-22 ENCOUNTER — Ambulatory Visit
Admission: EM | Admit: 2022-02-22 | Discharge: 2022-02-22 | Disposition: A | Discharge status: Home / Self Care | Payer: Managed Care, Other (non HMO) | Attending: Emergency Medicine | Admitting: Emergency Medicine

## 2022-02-22 DIAGNOSIS — R350 Frequency of micturition: Secondary | ICD-10-CM

## 2022-02-22 DIAGNOSIS — R300 Dysuria: Secondary | ICD-10-CM

## 2022-02-22 LAB — URINALYSIS, ROUTINE W REFLEX MICROSCOPIC
Bilirubin Urine: NEGATIVE
Glucose, UA: NEGATIVE mg/dL
Hgb urine dipstick: NEGATIVE
Ketones, ur: NEGATIVE mg/dL
Leukocytes,Ua: NEGATIVE
Nitrite: NEGATIVE
Protein, ur: NEGATIVE mg/dL
Specific Gravity, Urine: 1.015 (ref 1.005–1.030)
pH: 5.5 (ref 5.0–8.0)

## 2022-02-22 NOTE — ED Triage Notes (Signed)
Pt presents with frequency, mild dysuria x 1 week.  Mild abdominal discomfort.  Denies fever, noticeable hematuria.  ?

## 2022-02-22 NOTE — ED Provider Notes (Signed)
?[facility] ? ? ? ?CSN: [id number] ?Arrival date & time: 02/22/22  1308 ? ? ?  ? ?History   ?Chief Complaint ?No chief complaint on file. ? ? ?HPI ?George Harrington is a 51 y.o. male.  ? ?Patient presents with urinary frequency, urgency and a weakened stream and mild dysuria for 7 days.  Has not attempted treatment of symptoms.  Denies hematuria, abdominal or flank pain, penile discharge, penile or testicle swelling.  Endorses that symptoms have occurred in the past, resolved after starting Flomax.  History of BPH, managed by PCP.  ? ?Past Medical History:  ?Diagnosis Date  ? BPH (benign prostatic hyperplasia)   ? Hypertension   ? LVH (left ventricular hypertrophy)   ? ? ?Patient Active Problem List  ? Diagnosis Date Noted  ? BP (high blood pressure) 04/03/2016  ? Left ventricular hypertrophy 10/21/2013  ? 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 Adiposity 11/29/2011  ? ? ?Past Surgical History:  ?Procedure Laterality Date  ? ACHILLES TENDON REPAIR Left 2019  ? arm surgery Left 11/11/2002  ? ARTERY REPAIR Left   ? IMPACTED THIRD MOLAR REMOVAL    ? ? ? ? ? ?Home Medications   ? ?Prior to Admission medications   ?Medication Sig Start Date End Date Taking? Authorizing Provider  ?hydrochlorothiazide (HYDRODIURIL) 25 MG tablet Take 25 mg by mouth daily.   Yes [provider]  ?tamsulosin (FLOMAX) 0.4 MG CAPS capsule Take 0.4 mg by mouth.   Yes [provider]  ?amLODipine (NORVASC) 10 MG tablet Take 10 mg by mouth daily. 04/30/21   [provider]  ?azithromycin (ZITHROMAX) 250 MG tablet 2 tabs day one, then 1 tab daily ?Patient not taking: Reported on 07/23/2021 11/08/20   Lily Kocher, PA-C  ?fluticasone [facility]) 50 MCG/ACT nasal spray Place 2 sprays into both nostrils daily. ?Patient not taking: Reported on 07/23/2021 11/08/20   Lily Kocher, PA-C  ?ibuprofen (ADVIL) 400 MG tablet Take 400 mg by mouth as needed.    [provider]  ? ? ?Family History ?Family History   ?Problem Relation Age of Onset  ? Hematuria Neg Hx   ? Kidney cancer Neg Hx   ? Prostate cancer Neg Hx   ? ? ?Social History ?Social History  ? ?Tobacco Use  ? Smoking status: Never  ? Smokeless tobacco: Never  ?Vaping Use  ? Vaping Use: Never used  ?Substance Use Topics  ? Alcohol use: Yes  ?  Alcohol/week: 4.0 standard drinks  ?  Types: 4 Shots of liquor per week  ? Drug use: No  ? ? ? ?Allergies   ?Lisinopril ? ? ?Review of Systems ?Review of Systems  ?Genitourinary:  Positive for dysuria, frequency and urgency. Negative for decreased urine volume, difficulty urinating, enuresis, flank pain, genital sores, hematuria, penile discharge, penile pain, penile swelling, scrotal swelling and testicular pain.  ? ? ?Physical Exam ?Triage Vital Signs ?ED Triage Vitals  ?Enc Vitals Group  ?   BP 02/22/22 1336 (!) 146/104  ?   Pulse Rate 02/22/22 1336 87  ?   Resp 02/22/22 1336 18  ?   Temp 02/22/22 1336 98.3 ?F (36.8 ?C)  ?   Temp Source 02/22/22 1336 Oral  ?   SpO2 02/22/22 1336 100 %  ?   Weight --   ?   Height --   ?   Head Circumference --   ?   Peak Flow --   ?   Pain Score 02/22/22  1331 0  ?   Pain Loc --   ?   Pain Edu? --   ?   Excl. in [place]? --   ? ?No data found. ? ?Updated Vital Signs ?BP (!) 146/104 (BP Location: Right Arm)   Pulse 87   Temp 98.3 ?F (36.8 ?C) (Oral)   Resp 18   SpO2 100%  ? ?Visual Acuity ?Right Eye Distance:   ?Left Eye Distance:   ?Bilateral Distance:   ? ?Right Eye Near:   ?Left Eye Near:    ?Bilateral Near:    ? ?Physical Exam ?Constitutional:   ?   Appearance: Normal appearance.  ?Eyes:  ?   Extraocular Movements: Extraocular movements intact.  ?Pulmonary:  ?   Effort: Pulmonary effort is normal.  ?Genitourinary: ?   Comments: deferred ?Neurological:  ?   Mental Status: He is alert and oriented to person, place, and time. Mental status is at baseline.  ?Psychiatric:     ?   Mood and Affect: Mood normal.     ?   Behavior: Behavior normal.  ? ? ? ?UC Treatments / Results  ?Labs ?(all labs  ordered are listed, but only abnormal results are displayed) ?Labs Reviewed  ?URINALYSIS, ROUTINE W REFLEX MICROSCOPIC  ? ? ?EKG ? ? ?Radiology ?No results found. ? ?Procedures ?Procedures (including critical care time) ? ?Medications Ordered in UC ?Medications - No data to display ? ?Initial Impression / Assessment and Plan / UC Course  ?I have reviewed the triage vital signs and the nursing notes. ? ?Pertinent labs & imaging results that were available during my care of the patient were reviewed by me and considered in my medical decision making (see chart for details). ? ?Dysuria ?Urinary frequency ? ?Vital signs are stable, patient is in no signs of distress, urinalysis negative, discussed findings with patient, denies concern for sexually transmitted infections, will defer testing today, etiology of symptoms may be related to prostate, recommended follow-up with primary doctor also given walking referral to urology for further work-up and management ?Final Clinical Impressions(s) / UC Diagnoses  ? ?Final diagnoses:  ?None  ? ?Discharge Instructions   ?None ?  ? ?ED Prescriptions   ?None ?  ? ?PDMP not reviewed this encounter. ?  ?Hans Eden, NP ?02/22/22 1403 ? ?

## 2022-02-22 NOTE — Discharge Instructions (Signed)
The cause of your symptoms today is unknown ? ?Your urinalysis was negative indicating no signs of infection and you do not have concerns today for sexually transmitted infections therefore we will not test for them ? ?I would recommend follow-up with your primary doctor, you have also been given information for urology which is the bladder specialist who you may also follow-up with ? ?You may attempt use of over-the-counter Azo which may provide comfort for the burning sensation, do not use this medicine greater than 2 days, this medicine will also turn your urine orange ? ?May use over-the-counter Tylenol and ibuprofen as needed ?

## 2022-02-25 ENCOUNTER — Ambulatory Visit (INDEPENDENT_AMBULATORY_CARE_PROVIDER_SITE_OTHER): Payer: Managed Care, Other (non HMO) | Admitting: Internal Medicine

## 2022-02-25 VITALS — BP 142/92 | HR 86 | Resp 14 | Ht 68.0 in | Wt 257.0 lb

## 2022-02-25 DIAGNOSIS — G4733 Obstructive sleep apnea (adult) (pediatric): Secondary | ICD-10-CM | POA: Diagnosis not present

## 2022-02-25 DIAGNOSIS — Z7189 Other specified counseling: Secondary | ICD-10-CM | POA: Insufficient documentation

## 2022-02-25 DIAGNOSIS — Z9989 Dependence on other enabling machines and devices: Secondary | ICD-10-CM

## 2022-02-25 DIAGNOSIS — E669 Obesity, unspecified: Secondary | ICD-10-CM | POA: Diagnosis not present

## 2022-02-25 NOTE — Patient Instructions (Signed)

## 2022-02-25 NOTE — Progress Notes (Signed)
[facility] ?[address] ?[place], [place] [address] ? ?Pulmonary Sleep Medicine  ? ?Office Visit Note ? ?Patient Name: George Harrington ?DOB: November 12, 1970 ?MRN [id number] ? ? ? ?Chief Complaint: Obstructive Sleep Apnea visit ? ?Brief History: ? ?Zeev is seen today for initial consult to establish care. The patient has a 2 month history of sleep apnea. Patient is using PAP nightly with medium Airtouch foam full face mask.  The patient feels better after sleeping with PAP.  The patient reports benefiting from PAP use. Reported sleepiness is  improved and the Epworth Sleepiness Score is 6 out of 24. The patient does not take naps. The patient complains of the following: dry mouth and occasionally still wakes a little tired. Changed humidification from auto mode to manual @ 5.  The compliance download shows  compliance with an average use time of 7:38 hours@ 100%. The AHI is 2.3  The patient does not complain of limb movements disrupting sleep. ? ?ROS ? ?General: (-) fever, (-) chills, (-) night sweat ?Nose and Sinuses: (-) nasal stuffiness or itchiness, (-) postnasal drip, (-) nosebleeds, (-) sinus trouble. ?Mouth and Throat: (-) sore throat, (-) hoarseness. ?Neck: (-) swollen glands, (-) enlarged thyroid, (-) neck pain. ?Respiratory: - cough, - shortness of breath, - wheezing. ?Neurologic: - numbness, - tingling. ?Psychiatric: - anxiety, - depression ? ? ?Current Medication: ?Outpatient Encounter Medications as of 02/25/2022  ?Medication Sig  ? hydrochlorothiazide (HYDRODIURIL) 25 MG tablet Take 25 mg by mouth daily.  ? tamsulosin (FLOMAX) 0.4 MG CAPS capsule Take 0.4 mg by mouth.  ? ?No facility-administered encounter medications on file as of 02/25/2022.  ? ? ?Surgical History: ?Past Surgical History:  ?Procedure Laterality Date  ? ACHILLES TENDON REPAIR Left 2019  ? arm surgery Left 11/11/2002  ? ARTERY REPAIR Left   ? IMPACTED THIRD MOLAR REMOVAL    ? ? ?Medical History: ?Past Medical History:   ?Diagnosis Date  ? BPH (benign prostatic hyperplasia)   ? Hypertension   ? LVH (left ventricular hypertrophy)   ? ? ?Family History: ?Non contributory to the present illness ? ?Social History: ?Social History  ? ?Socioeconomic History  ? Marital status: Single  ?  Spouse name: Not on file  ? Number of children: Not on file  ? Years of education: Not on file  ? Highest education level: Not on file  ?Occupational History  ? Not on file  ?Tobacco Use  ? Smoking status: Never  ? Smokeless tobacco: Former  ?Vaping Use  ? Vaping Use: Never used  ?Substance and Sexual Activity  ? Alcohol use: Yes  ?  Alcohol/week: 4.0 standard drinks  ?  Types: 4 Shots of liquor per week  ? Drug use: No  ? Sexual activity: Not on file  ?Other Topics Concern  ? Not on file  ?Social History Narrative  ? Not on file  ? ?Social Determinants of Health  ? ?Financial Resource Strain: Not on file  ?Food Insecurity: Not on file  ?Transportation Needs: Not on file  ?Physical Activity: Not on file  ?Stress: Not on file  ?Social Connections: Not on file  ?Intimate Partner Violence: Not on file  ? ? ?Vital Signs: ?Blood pressure (!) 142/92, pulse 86, resp. rate 14, height 5\' 8"  (1.727 m), weight 257 lb (116.6 kg), SpO2 98 %. ?Body mass index is 39.08 kg/m?.  ? ? ?Examination: ?General Appearance: The patient is well-developed, well-nourished, and in no distress. ?Neck Circumference: 51 cm ?Skin: Gross inspection of  skin unremarkable. ?Head: normocephalic, no gross deformities. ?Eyes: no gross deformities noted. ?ENT: ears appear grossly normal ?Neurologic: Alert and oriented. No involuntary movements. ? ? ? ?EPWORTH SLEEPINESS SCALE: ? ?Scale:  ?(0)= no chance of dozing; (1)= slight chance of dozing; (2)= moderate chance of dozing; (3)= high chance of dozing ? ?Chance  Situtation ?   ?Sitting and reading: 1 ?  ? Watching TV: 1 ?   ?Sitting Inactive in public: 0 ?   ?As a passenger in car: 1   ?   ?Lying down to rest: 2 ?   ?Sitting and talking: 0 ?    ?Sitting quielty after lunch: 1 ?   ?In a car, stopped in traffic: 0 ? ? ?TOTAL SCORE:   6 out of 24 ? ? ? ?SLEEP STUDIES: ? ?HST 12/14/21  -  overall AHI 17.4,  REM AHI 93.9,  Min SpO2 86%, ? ? ?CPAP COMPLIANCE DATA: ? ?Date Range: 01/23/22 - 02/21/22 ? ?Average Daily Use: 7:38 hours ? ?Median Use: 7:46 hours ? ?Compliance for > 4 Hours: 100% days ? ?AHI: 2.3 respiratory events per hour ? ?Days Used: 30/30 ? ?Mask Leak: 17.8 lpm ? ?95th Percentile Pressure: 18.3 cmH2O ? ? ? ?LABS: ?Recent Results (from the past 2160 hour(s))  ?Urinalysis, Routine w reflex microscopic Urine, Clean Catch     Status: None  ? Collection Time: 02/22/22  1:39 PM  ?Result Value Ref Range  ? Color, Urine YELLOW YELLOW  ? APPearance CLEAR CLEAR  ? Specific Gravity, Urine 1.015 1.005 - 1.030  ? pH 5.5 5.0 - 8.0  ? Glucose, UA NEGATIVE NEGATIVE mg/dL  ? Hgb urine dipstick NEGATIVE NEGATIVE  ? Bilirubin Urine NEGATIVE NEGATIVE  ? Ketones, ur NEGATIVE NEGATIVE mg/dL  ? Protein, ur NEGATIVE NEGATIVE mg/dL  ? Nitrite NEGATIVE NEGATIVE  ? Leukocytes,Ua NEGATIVE NEGATIVE  ?  Comment: Microscopic not done on urines with negative protein, blood, leukocytes, nitrite, or glucose < 500 mg/dL. ?Performed at [facility], [address], [place], [place] [address] ?  ? ? ?Radiology: ?No results found. ? ?No results found. ? ?No results found. ? ? ? ?Assessment and Plan: ?Patient Active Problem List  ? Diagnosis Date Noted  ? OSA on CPAP 02/25/2022  ? CPAP use counseling 02/25/2022  ? Obesity (BMI 30-39.9) 02/25/2022  ? BP (high blood pressure) 04/03/2016  ? Left ventricular hypertrophy 10/21/2013  ? 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 Adiposity 11/29/2011  ? ?1. OSA on CPAP ?The patient does tolerate PAP and reports  benefit from PAP use. The patient was reminded how to clean equipment and advised to replace supplies routinely. Given his use of the higher pressures, will adjust to 10-20 cm APAP. The patient was also counselled on weight  loss. The compliance is excellent. The AHI is 2.3. ? ? ?OSA- continue with excellent compliance. Adjust apap to 10-20. F/u 13m. 2 week download.  ? ?2. CPAP use counseling ?CPAP Counseling: had a lengthy discussion with the patient regarding the importance of PAP therapy in management of the sleep apnea. Patient appears to understand the risk factor reduction and also understands the risks associated with untreated sleep apnea. Patient will try to make a good faith effort to remain compliant with therapy. Also instructed the patient on proper cleaning of the device including the water must be changed daily if possible and use of distilled water is preferred. Patient understands that the machine should be regularly cleaned with appropriate recommended cleaning solutions  that do not damage the PAP machine for example given white vinegar and water rinses. Other methods such as ozone treatment may not be as good as these simple methods to achieve cleaning.  ? ?3. Obesity (BMI 30-39.9) ?Obesity Counseling: Had a lengthy discussion regarding patients BMI and weight issues. Patient was instructed on portion control as well as increased activity. Also discussed caloric restrictions with trying to maintain intake less than 2000 Kcal. Discussions were made in accordance with the 5As of weight management. Simple actions such as not eating late and if able to, taking a walk is suggested.   ? ? ?General Counseling: I have discussed the findings of the evaluation and examination with Cristal Deerhristopher.  I have also discussed any further diagnostic evaluation thatmay be needed or ordered today. Jarmon verbalizes understanding of the findings of todays visit. We also reviewed his medications today and discussed drug interactions and side effects including but not limited excessive drowsiness and altered mental states. We also discussed that there is always a risk not just to him but also people around him. he has been encouraged to  call the office with any questions or concerns that should arise related to todays visit. ? ?No orders of the defined types were placed in this encounter. ?  ? ? ? ? ?I have personally obtained a history,

## 2022-03-12 ENCOUNTER — Other Ambulatory Visit: Payer: Self-pay

## 2022-03-12 DIAGNOSIS — R300 Dysuria: Secondary | ICD-10-CM

## 2022-03-13 ENCOUNTER — Ambulatory Visit (INDEPENDENT_AMBULATORY_CARE_PROVIDER_SITE_OTHER): Payer: Managed Care, Other (non HMO) | Admitting: Urology

## 2022-03-13 ENCOUNTER — Encounter: Payer: Self-pay | Admitting: Urology

## 2022-03-13 ENCOUNTER — Other Ambulatory Visit
Admission: RE | Admit: 2022-03-13 | Discharge: 2022-03-13 | Disposition: A | Discharge status: Home / Self Care | Payer: Managed Care, Other (non HMO) | Attending: Urology | Admitting: Urology

## 2022-03-13 VITALS — BP 173/99 | HR 70 | Ht 68.0 in | Wt 245.0 lb

## 2022-03-13 DIAGNOSIS — N401 Enlarged prostate with lower urinary tract symptoms: Secondary | ICD-10-CM

## 2022-03-13 DIAGNOSIS — R300 Dysuria: Secondary | ICD-10-CM | POA: Diagnosis not present

## 2022-03-13 DIAGNOSIS — Z125 Encounter for screening for malignant neoplasm of prostate: Secondary | ICD-10-CM | POA: Insufficient documentation

## 2022-03-13 DIAGNOSIS — R399 Unspecified symptoms and signs involving the genitourinary system: Secondary | ICD-10-CM

## 2022-03-13 LAB — URINALYSIS, COMPLETE (UACMP) WITH MICROSCOPIC
Bilirubin Urine: NEGATIVE
Glucose, UA: NEGATIVE mg/dL
Hgb urine dipstick: NEGATIVE
Ketones, ur: NEGATIVE mg/dL
Leukocytes,Ua: NEGATIVE
Nitrite: NEGATIVE
Protein, ur: NEGATIVE mg/dL
Specific Gravity, Urine: 1.02 (ref 1.005–1.030)
pH: 6 (ref 5.0–8.0)

## 2022-03-13 MED ORDER — DOXYCYCLINE HYCLATE 100 MG PO CAPS: 100.0000 mg | ORAL_CAPSULE | Freq: Two times a day (BID) | ORAL | 0 refills | Status: DC

## 2022-03-13 NOTE — Patient Instructions (Signed)
Urethritis, Adult ? ?Urethritis is a swelling (inflammation) of the urethra. The urethra is the tube that drains urine from the bladder. It is important to get treatment for this condition early. Delayed treatment may lead to complications, such as an infection in the urinary tract (ureters, kidneys, and bladder). ?What are the causes? ?This condition may be caused by: ?Germs that are spread through sexual contact. This is the leading cause of urethritis. This may include bacterial or viral infections. ?Injury to the urethra. This can happen after a thin, flexible tube (catheter) is inserted into the urethra to drain urine, or after medical instruments or foreign bodies are inserted into the area. ?Chemical irritation. This may include contact with spermicide or prolonged contact with chemicals in bubble bath, shampoo, or perfumed soaps. ?A disease that causes inflammation. This is rare. ?What increases the risk? ?The following factors may make you more likely to develop this condition: ?Having sex without using a condom. ?Having multiple sexual partners. ?Having poor hygiene. ?What are the signs or symptoms? ?Symptoms of this condition include: ?Pain with urination. ?Frequent urination. ?An urgent need to urinate. ?Itching and pain in the vagina or penis. ?Discharge or bleeding coming from the penis. ?Most women have no symptoms. ?How is this diagnosed? ?This condition may be diagnosed based on: ?Your symptoms. ?Your medical history. ?A physical exam. ?Tests may also be done. These may include: ?Urine tests. ?Swabs from the urethra. ?How is this treated? ?Treatment for this condition depends on the cause. ?Urethritis caused by a bacterial infection is treated with antibiotic medicine. ?Sexual partners must also be treated. ?Follow these instructions at home: ?Medicines ?Take over-the-counter and prescription medicines only as told by your health care provider. ?If you were prescribed an antibiotic, take it as told  by your health care provider. Do not stop taking the antibiotic even if you start to feel better. ?Lifestyle ?Avoid using perfumed soaps, bubble bath, and shampoo when you bathe or shower. Rinse the vaginal area after bathing. ?Wear cotton underwear. Not wearing underwear when going to sleep can help. ?Make sure to wipe from front to back after using the toilet if you are male. ?Do not have sex until your health care provider approves. When you do have sex, be sure to practice safe sex. ?Any sexual partners you have had in the past 60 days should be treated. ?General instructions ?Drink enough fluid to keep your urine pale yellow. ?It is up to you to get your test results. Ask your health care provider, or the department that is doing the test, when your results will be ready. ?Keep all follow-up visits. This is important. ?Get tested again 3 months after treatment to make sure the infection is gone. It is important that your sexual partner also gets tested again. ?Contact a health care provider if: ?Your symptoms have not improved after 3 days. ?Your symptoms get worse. ?You have eye redness or pain. ?You develop abdominal pain or pelvic pain (in females). ?You develop joint pain or a rash. ?You have a fever or chills. ?Get help right away if: ?You have severe pain in the belly, back, or side. ?You vomit repeatedly. ?Summary ?Urethritis is a swelling (inflammation) of the urethra. ?Germs that are spread through sexual contact are the most common cause of this condition. ?It is important to get treatment for this condition early. Delayed treatment may lead to complications. ?Treatment for this condition depends on the cause. Any sexual partners must also be treated. ?This information is not  intended to replace advice given to you by your health care provider. Make sure you discuss any questions you have with your health care provider. ?Document Revised: 06/04/2020 Document Reviewed: 06/04/2020 ?Elsevier Patient  Education ? 2023 Else[employer] ? ?Cystoscopy ?Cystoscopy is a procedure that is used to help diagnose and sometimes treat conditions that affect the lower urinary tract. The lower urinary tract includes the bladder and the urethra. The urethra is the tube that drains urine from the bladder. Cystoscopy is done using a thin, tube-shaped instrument with a light and camera at the end (cystoscope). The cystoscope may be hard or flexible, depending on the goal of the procedure. The cystoscope is inserted through the urethra, into the bladder. ?Cystoscopy may be recommended if you have: ?Urinary tract infections that keep coming back. ?Blood in the urine (hematuria). ?An inability to control when you urinate (urinary incontinence) or an overactive bladder. ?Unusual cells found in a urine sample. ?A blockage in the urethra, such as a urinary stone. ?Painful urination. ?An abnormality in the bladder found during an intravenous pyelogram (IVP) or CT scan. ?What are the risks? ?Generally, this is a safe procedure. However, problems may occur, including: ?Infection. ?Bleeding. ? ?What happens during the procedure? ? ?You will be given one or more of the following: ?A medicine to numb the area (local anesthetic). ?The area around the opening of your urethra will be cleaned. ?The cystoscope will be passed through your urethra into your bladder. ?Germ-free (sterile) fluid will flow through the cystoscope to fill your bladder. The fluid will stretch your bladder so that your health care provider can clearly examine your bladder walls. ?Your doctor will look at the urethra and bladder. ?The cystoscope will be removed ?The procedure may vary among health care providers  ?What can I expect after the procedure? ?After the procedure, it is common to have: ?Some soreness or pain in your urethra. ?Urinary symptoms. These include: ?Mild pain or burning when you urinate. Pain should stop within a few minutes after you urinate. This may last  for up to a few days after the procedure. ?A small amount of blood in your urine for several days. ?Feeling like you need to urinate but producing only a small amount of urine. ?Follow these instructions at home: ?General instructions ?Return to your normal activities as told by your health care provider.  ?Drink plenty of fluids after the procedure. ?Keep all follow-up visits as told by your health care provider. This is important. ?Contact a health care provider if you: ?Have pain that gets worse or does not get better with medicine, especially pain when you urinate lasting longer than 72 hours after the procedure. ?Have trouble urinating. ?Get help right away if you: ?Have blood clots in your urine. ?Have a fever or chills. ?Are unable to urinate. ?Summary ?Cystoscopy is a procedure that is used to help diagnose and sometimes treat conditions that affect the lower urinary tract. ?Cystoscopy is done using a thin, tube-shaped instrument with a light and camera at the end. ?After the procedure, it is common to have some soreness or pain in your urethra. ?It is normal to have blood in your urine after the procedure.  ?If you were prescribed an antibiotic medicine, take it as told by your health care provider.  ?This information is not intended to replace advice given to you by your health care provider. Make sure you discuss any questions you have with your health care provider. ?Document Revised: 10/20/2018 Document Reviewed: 10/20/2018 ?  Elsevier Patient Education ? 2020 Elsevier Inc. ? ?

## 2022-03-13 NOTE — Progress Notes (Signed)
? ?  03/13/22 ?12:37 PM  ? ?George Harrington ?02-22-1971 ?[id number] ? ?CC: Dysuria, urinary symptoms, BPH, PSA screening ? ?HPI: ?I saw George Harrington for the above issues.  He was seen by George Cowboy, PA in 2017, and PSA was normal at that time at 1.6 and he opted to start Flomax for his urinary symptoms from BPH.  He has been on Flomax long-term which he feels significantly improved his urinary symptoms.  He was recently seen in urgent care on 02/22/2022 with some dysuria and irritation with urination, and dipstick urinalysis was benign.  He reports his symptoms improved temporarily when he took a short course of doxycycline through his PCP for a tick bite, but symptoms have returned.  Seems like it is more irritation than true burning or pain.  He continues on the Flomax.  He denies any gross hematuria. ? ?Urinalysis today benign, PVR normal at 0 mL. ? ? ?PMH: ?Past Medical History:  ?Diagnosis Date  ? BPH (benign prostatic hyperplasia)   ? Hypertension   ? LVH (left ventricular hypertrophy)   ? ? ?Surgical History: ?Past Surgical History:  ?Procedure Laterality Date  ? ACHILLES TENDON REPAIR Left 2019  ? arm surgery Left 11/11/2002  ? ARTERY REPAIR Left   ? IMPACTED THIRD MOLAR REMOVAL    ? ? ? ?Family History: ?Family History  ?Problem Relation Age of Onset  ? Hematuria Neg Hx   ? Kidney cancer Neg Hx   ? Prostate cancer Neg Hx   ? ? ?Social History:  reports that he has never smoked. He has quit using smokeless tobacco. He reports current alcohol use of about 4.0 standard drinks per week. He reports that he does not use drugs. ? ?Physical Exam: ?BP (!) 173/99 (BP Location: Left Arm, Patient Position: Sitting, Cuff Size: Large)   Pulse 70   Ht 5\' 8"  (1.727 m)   Wt 245 lb (111.1 kg)   BMI 37.25 kg/m?   ? ?Constitutional:  Alert and oriented, No acute distress. ?Cardiovascular: No clubbing, cyanosis, or edema. ?Respiratory: Normal respiratory effort, no increased work of breathing. ?GI: Abdomen is soft,  nontender, nondistended, no abdominal masses ?GU: Patent meatus, no lesions ? ?Laboratory Data: ?Reviewed ? ?Assessment & Plan:   ?51 year old male with long history of BPH and urinary symptoms on Flomax, who has had some increased irritation with voiding and occasional dysuria of unclear etiology.  He would like to continue PSA screening, and PSA was ordered today.  He had some temporary improvement on doxycycline, and will prescribe a longer 7-day course for possible urethritis.  We also discussed considering cystoscopy in the future, and he would like to start with the doxycycline to see if his symptoms improve prior to moving forward with cystoscopy. ? ?-Continue Flomax ?-Doxycycline 100 mg twice daily x7 days for possible urethritis ?-Follow-up PSA ?-RTC 6 weeks for cystoscopy, can change to office visit if symptoms resolved.  May be a good candidate for UroLift in the future ? ?38, MD ?03/13/2022 ? ?[facility] ?[address] ?[place], [place] ?[phone] ? ? ?

## 2022-03-14 LAB — PSA, TOTAL AND FREE
PSA, Free Pct: 21.4 %
PSA, Free: 0.3 ng/mL
Prostate Specific Ag, Serum: 1.4 ng/mL (ref 0.0–4.0)

## 2022-04-23 ENCOUNTER — Other Ambulatory Visit: Payer: Managed Care, Other (non HMO) | Admitting: Urology

## 2022-04-24 ENCOUNTER — Ambulatory Visit: Payer: Managed Care, Other (non HMO) | Admitting: Urology

## 2022-04-24 ENCOUNTER — Ambulatory Visit (INDEPENDENT_AMBULATORY_CARE_PROVIDER_SITE_OTHER): Payer: Managed Care, Other (non HMO) | Admitting: Urology

## 2022-04-24 ENCOUNTER — Encounter: Payer: Self-pay | Admitting: Urology

## 2022-04-24 VITALS — BP 162/95 | HR 68 | Ht 68.0 in | Wt 245.0 lb

## 2022-04-24 DIAGNOSIS — R399 Unspecified symptoms and signs involving the genitourinary system: Secondary | ICD-10-CM

## 2022-04-24 DIAGNOSIS — R300 Dysuria: Secondary | ICD-10-CM

## 2022-04-24 MED ORDER — URO-MP 118 MG PO CAPS: 118.0000 mg | ORAL_CAPSULE | Freq: Three times a day (TID) | ORAL | 0 refills | Status: AC

## 2022-04-24 MED ORDER — URO-MP 118 MG PO CAPS: 118.0000 mg | ORAL_CAPSULE | Freq: Three times a day (TID) | ORAL | 0 refills | Status: DC

## 2022-04-24 NOTE — Progress Notes (Signed)
Cystoscopy Procedure Note:  Indication: Irritative voiding symptoms  Patient with long history of BPH on Flomax, who reports 3 months of intermittent irritation with urination of unclear etiology.  Has had some mild to moderate improvement after a course of doxycycline.  UA has been benign, and PVR has been normal.  Drinks 4 cups of coffee per day.  PSA normal at 1.4.  After informed consent and discussion of the procedure and its risks, George Harrington was positioned and prepped in the standard fashion. Cystoscopy was performed with a flexible cystoscope. The urethra, bladder neck and entire bladder was visualized in a standard fashion. The prostate was small and appeared open without significant obstruction. The ureteral orifices were visualized in their normal location and orientation.  Bladder mucosa grossly normal throughout, no abnormalities on retroflexion.  Findings: Normal cystoscopy, short nonobstructive appearing prostate  ----------------------------------------------------------------------------------------  Assessment and Plan: 51 year old male with 3 months of intermittent irritation with voiding of unclear etiology.  Has been on Flomax long-term for BPH.  Cystoscopy today benign.  We discussed possible etiologies including prostatitis, urethritis, BPH, IC, and idiopathic.  I recommended cutting back on caffeine to see if this improves some of his urinary symptoms, as well as a 10-day course of Uribel.  Could consider UroLift in the future to get off of Flomax, however I am not convinced this would improve his irritative voiding symptoms.  UroLift information provided today.  -Trial of Uribel 10 days -Discussed behavioral strategies including avoiding caffeine and artificial sweeteners -RTC 2 to 3 months symptom check  Legrand Rams, MD 04/24/2022

## 2022-06-24 ENCOUNTER — Ambulatory Visit (INDEPENDENT_AMBULATORY_CARE_PROVIDER_SITE_OTHER): Payer: Managed Care, Other (non HMO) | Admitting: Internal Medicine

## 2022-06-24 VITALS — BP 155/89 | HR 71 | Resp 14 | Ht 68.0 in | Wt 240.0 lb

## 2022-06-24 DIAGNOSIS — Z9989 Dependence on other enabling machines and devices: Secondary | ICD-10-CM | POA: Diagnosis not present

## 2022-06-24 DIAGNOSIS — G4733 Obstructive sleep apnea (adult) (pediatric): Secondary | ICD-10-CM

## 2022-06-24 DIAGNOSIS — E669 Obesity, unspecified: Secondary | ICD-10-CM

## 2022-06-24 DIAGNOSIS — Z7189 Other specified counseling: Secondary | ICD-10-CM | POA: Diagnosis not present

## 2022-06-24 NOTE — Patient Instructions (Signed)

## 2022-06-24 NOTE — Progress Notes (Signed)
[facility] [address] [place], [place] [address]  Pulmonary Sleep Medicine   Office Visit Note  Patient Name: MAC DOWDELL DOB: 16-Jun-1971 MRN [id number]    Chief Complaint: Obstructive Sleep Apnea visit  Brief History:  Welby is seen today for a follow up visit for APAP@ 10-20 cmH2O. The patient has a 6 month history of sleep apnea. Patient is using PAP nightly.  The patient feels rested after sleeping with PAP.  The patient reports benefiting from PAP use. Reported sleepiness is  improved and the Epworth Sleepiness Score is 4 out of 24. The patient does not take naps. The patient complains of the following: No complaints with the therapy at this time.  The compliance download shows 97% compliance with an average use time of 6 hours 44 minutes. The AHI is 1.0.  The patient does not complain of limb movements disrupting sleep. The patient continues to require PAP therapy as a medical necessity in order to eliminate his sleep apnea.   ROS  General: (-) fever, (-) chills, (-) night sweat Nose and Sinuses: (-) nasal stuffiness or itchiness, (-) postnasal drip, (-) nosebleeds, (-) sinus trouble. Mouth and Throat: (-) sore throat, (-) hoarseness. Neck: (-) swollen glands, (-) enlarged thyroid, (-) neck pain. Respiratory: - cough, - shortness of breath, - wheezing. Neurologic: - numbness, - tingling. Psychiatric: - anxiety, - depression   Current Medication: Outpatient Encounter Medications as of 06/24/2022  Medication Sig   hydrochlorothiazide (HYDRODIURIL) 25 MG tablet Take 25 mg by mouth daily.   tamsulosin (FLOMAX) 0.4 MG CAPS capsule Take 0.4 mg by mouth.   No facility-administered encounter medications on file as of 06/24/2022.    Surgical History: Past Surgical History:  Procedure Laterality Date   ACHILLES TENDON REPAIR Left 2019   arm surgery Left 11/11/2002   ARTERY REPAIR Left    IMPACTED THIRD MOLAR REMOVAL      Medical History: Past  Medical History:  Diagnosis Date   BPH (benign prostatic hyperplasia)    Hypertension    LVH (left ventricular hypertrophy)     Family History: Non contributory to the present illness  Social History: Social History   Socioeconomic History   Marital status: Single    Spouse name: Not on file   Number of children: Not on file   Years of education: Not on file   Highest education level: Not on file  Occupational History   Not on file  Tobacco Use   Smoking status: Never   Smokeless tobacco: Former  Building services engineer Use: Never used  Substance and Sexual Activity   Alcohol use: Yes    Alcohol/week: 4.0 standard drinks of alcohol    Types: 4 Shots of liquor per week   Drug use: No   Sexual activity: Yes  Other Topics Concern   Not on file  Social History Narrative   Not on file   Social Determinants of Health   Financial Resource Strain: Not on file  Food Insecurity: Not on file  Transportation Needs: Not on file  Physical Activity: Not on file  Stress: Not on file  Social Connections: Not on file  Intimate Partner Violence: Not on file    Vital Signs: Blood pressure (!) 155/89, pulse 71, resp. rate 14, height 5\' 8"  (1.727 m), weight 240 lb (108.9 kg), SpO2 98 %. Body mass index is 36.49 kg/m.    Examination: General Appearance: The patient is well-developed, well-nourished, and in no distress. Neck Circumference:  45 cm Skin: Gross inspection of skin unremarkable. Head: normocephalic, no gross deformities. Eyes: no gross deformities noted. ENT: ears appear grossly normal Neurologic: Alert and oriented. No involuntary movements.  STOP BANG RISK ASSESSMENT S (snore) Have you been told that you snore?     NO   T (tired) Are you often tired, fatigued, or sleepy during the day?   NO  O (obstruction) Do you stop breathing, choke, or gasp during sleep? NO   P (pressure) Do you have or are you being treated for high blood pressure? YES   B (BMI) Is your  body index greater than 35 kg/m? YES   A (age) Are you 53 years old or older? YES   N (neck) Do you have a neck circumference greater than 16 inches?   YES   G (gender) Are you a male? YES   TOTAL STOP/BANG "YES" ANSWERS 5       A STOP-Bang score of 2 or less is considered low risk, and a score of 5 or more is high risk for having either moderate or severe OSA. For people who score 3 or 4, doctors may need to perform further assessment to determine how likely they are to have OSA.         EPWORTH SLEEPINESS SCALE:  Scale:  (0)= no chance of dozing; (1)= slight chance of dozing; (2)= moderate chance of dozing; (3)= high chance of dozing  Chance  Situtation    Sitting and reading: 0    Watching TV: 1    Sitting Inactive in public: 0    As a passenger in car: 1      Lying down to rest: 2    Sitting and talking: 0    Sitting quielty after lunch: 0    In a car, stopped in traffic: 0   TOTAL SCORE:   4 out of 24    SLEEP STUDIES:  HST (12/2021) AHI 17/.4/hr, REM AHI 93.9/hr, min SpO2 86%   CPAP COMPLIANCE DATA:  Date Range: 03/24/2022-06/21/2022  Average Daily Use: 6 hours 44 minutes  Median Use: 7 hours 2 minutes  Compliance for > 4 Hours: 97%  AHI: 1.0 respiratory events per hour  Days Used: 90/90 days  Mask Leak: 15.5  95th Percentile Pressure: 16.9         LABS: No results found for this or any previous visit (from the past 2160 hour(s)).  Radiology: No results found.  No results found.  No results found.    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 OSA on CPAP The patient does tolerate PAP and reports  benefit from PAP use. The patient was reminded how to  clean equipment and advised to replace supplies routinely. The patient was also counselled on weight loss. The compliance is excellent. The AHI is 1.0.   OSA on cpap-- CPAP continues to be medically necessary to treat this patient's OSA.  Continue excellent compliance. F/u one year.    2. CPAP use counseling CPAP Counseling: had a lengthy discussion with the patient regarding the importance of PAP therapy in management of the sleep apnea. Patient appears to understand the risk factor reduction and also understands the risks  associated with untreated sleep apnea. Patient will try to make a good faith effort to remain compliant with therapy. Also instructed the patient on proper cleaning of the device including the water must be changed daily if possible and use of distilled water is preferred. Patient understands that the machine should be regularly cleaned with appropriate recommended cleaning solutions that do not damage the PAP machine for example given white vinegar and water rinses. Other methods such as ozone treatment may not be as good as these simple methods to achieve cleaning.   3. Obesity (BMI 30-39.9) Obesity Counseling: Had a lengthy discussion regarding patients BMI and weight issues. Patient was instructed on portion control as well as increased activity. Also discussed caloric restrictions with trying to maintain intake less than 2000 Kcal. Discussions were made in accordance with the 5As of weight management. Simple actions such as not eating late and if able to, taking a walk is suggested.    General Counseling: I have discussed the findings of the evaluation and examination with Cristal Deer.  I have also discussed any further diagnostic evaluation thatmay be needed or ordered today. Sabastian verbalizes understanding of the findings of todays visit. We also reviewed his medications today and discussed drug interactions and side effects including but not limited excessive drowsiness  and altered mental states. We also discussed that there is always a risk not just to him but also people around him. he has been encouraged to call the office with any questions or concerns that should arise related to todays visit.  No orders of the defined types were placed in this encounter.       I have personally obtained a history, examined the patient, evaluated laboratory and imaging results, formulated the assessment and plan and placed orders. This patient was seen today by Emmaline Kluver, PA-C in collaboration with Dr. Freda Munro.   Yevonne Pax, MD [facility] Diplomate ABMS Pulmonary Critical Care Medicine and Sleep Medicine

## 2022-06-25 ENCOUNTER — Other Ambulatory Visit
Admission: RE | Admit: 2022-06-25 | Discharge: 2022-06-25 | Disposition: A | Discharge status: Home / Self Care | Payer: Managed Care, Other (non HMO) | Attending: Urology | Admitting: Urology

## 2022-06-25 ENCOUNTER — Ambulatory Visit (INDEPENDENT_AMBULATORY_CARE_PROVIDER_SITE_OTHER): Payer: Managed Care, Other (non HMO) | Admitting: Urology

## 2022-06-25 ENCOUNTER — Other Ambulatory Visit: Payer: Self-pay | Admitting: *Deleted

## 2022-06-25 ENCOUNTER — Encounter: Payer: Self-pay | Admitting: Urology

## 2022-06-25 VITALS — BP 147/89 | HR 61 | Ht 68.0 in | Wt 240.0 lb

## 2022-06-25 DIAGNOSIS — R399 Unspecified symptoms and signs involving the genitourinary system: Secondary | ICD-10-CM

## 2022-06-25 DIAGNOSIS — N138 Other obstructive and reflux uropathy: Secondary | ICD-10-CM | POA: Diagnosis not present

## 2022-06-25 DIAGNOSIS — N401 Enlarged prostate with lower urinary tract symptoms: Secondary | ICD-10-CM | POA: Diagnosis not present

## 2022-06-25 DIAGNOSIS — N410 Acute prostatitis: Secondary | ICD-10-CM

## 2022-06-25 LAB — URINALYSIS, COMPLETE (UACMP) WITH MICROSCOPIC
Bilirubin Urine: NEGATIVE
Glucose, UA: NEGATIVE mg/dL
Hgb urine dipstick: NEGATIVE
Leukocytes,Ua: NEGATIVE
Nitrite: NEGATIVE
Protein, ur: NEGATIVE mg/dL
RBC / HPF: NONE SEEN RBC/hpf (ref 0–5)
Specific Gravity, Urine: 1.025 (ref 1.005–1.030)
pH: 5.5 (ref 5.0–8.0)

## 2022-06-25 LAB — BLADDER SCAN AMB NON-IMAGING

## 2022-06-25 MED ORDER — SULFAMETHOXAZOLE-TRIMETHOPRIM 800-160 MG PO TABS: 1.0000 | ORAL_TABLET | Freq: Two times a day (BID) | ORAL | 0 refills | Status: AC

## 2022-06-25 NOTE — Patient Instructions (Signed)
Consider cutting back on some red meat and dairy, as some studies have suggested this can exacerbate prostate problems.  Prostatitis  Prostatitis is swelling or inflammation of the prostate gland, also called the prostate. This gland is about 1.5 inches wide and 1 inch high, and it is involved in making semen. The prostate is located below a man's bladder, in front of the rectum. There are four types of prostatitis: Chronic prostatitis (CP), also called chronic pelvic pain syndrome (CPPS). This is the most common type of prostatitis. It is associated with increased muscle tone in the area between the hip bones (pelvic area), around the prostate. This type is also known as a pelvic floor disorder. Chronic bacterial prostatitis. This type usually results from an acute bacterial infection in the prostate gland that keeps coming back or has not been treated properly. The symptoms are less severe than those caused by acute bacterial prostatitis, which lasts a shorter time. Asymptomatic inflammatory prostatitis. This type does not have symptoms and does not need treatment. This is diagnosed when tests are done for other disorders of the urinary tract or reproductive tract. Acute bacterial prostatitis. This type starts quickly and results from an acute bacterial infection in the prostate gland. It is usually associated with a bladder infection, high fever, and chills. This is the least common type of prostatitis. What are the causes? Bacterial prostatitis is caused by an infection from bacteria. Chronic nonbacterial prostatitis may be caused by: Factors related to the nervous system. This system includes thebrain, spinal cord, and nerves. An autoimmune response. This happens when the body's disease-fighting system attacks healthy tissue in the body by mistake. Psychological factors. These have to do with how the mind works. The causes of the other types of prostatitis are usually not known. What are the  signs or symptoms? Symptoms of this condition depend on the type of prostatitis you have. Acute bacterial prostatitis Symptoms may include: Pain or burning during urination. Frequent and sudden urges to urinate. Trouble starting to urinate. Fever. Chills. Pain in your muscles or joints, lower back, or lower abdomen. Other types of prostatitis Symptoms may include: Sudden urges to urinate, or urinating often. Trouble starting to urinate. Weak urine stream. Dribbling after urination. Discharge coming from the penis. Pain in the testicles, the penis, or the tip of the penis. Pain in the area in front of the rectum and below the scrotum (perineum). Pain when ejaculating. How is this diagnosed? This condition may be diagnosed based on: A physical and medical exam. A digital rectal exam. For this, the health care provider may use a finger to feel the prostate. A urine test to check for bacteria. A semen sample or blood tests. Ultrasound. Urodynamic tests to check how your body handles urine. Cystoscopy to look inside your bladder or inside the part of your body that drains urine from the bladder (urethra). How is this treated? Treatment for this condition depends on the type of prostatitis. Treatment may involve: Medicines to relieve pain or inflammation, or to help relax your muscles. Physical therapy. Heat therapy. Biofeedback. These techniques help you control certain body functions. Relaxation exercises. Antibiotic medicine, if your condition is caused by bacteria. Sitz baths. These warm water baths help to relax your pelvic floor muscles, which helps to relieve pressure on the prostate. Follow these instructions at home: Medicines Take over-the-counter and prescription medicines only as told by your health care provider. If you were prescribed an antibiotic medicine, take it as told by your  health care provider. Do not stop using the antibiotic even if you start to feel  better. Managing pain and swelling  Take sitz baths as directed by your health care provider. For a sitz bath, sit in warm water that is deep enough to cover your hips and buttocks. If directed, apply heat to the affected area as often as told by your health care provider. Use the heat source that your health care provider recommends, such as a moist heat pack or a heating pad. Place a towel between your skin and the heat source. Leave the heat on for 20-30 minutes. Remove the heat if your skin turns bright red. This is especially important if you are unable to feel pain, heat, or cold. You may have a greater risk of getting burned. General instructions Do exercises as told by your health care provider, if you were prescribed physical therapy, biofeedback, or relaxation exercises. Keep all follow-up visits as told by your health care provider. This is important. Where to find more information [employer] of Diabetes and Digestive and Kidney Diseases: LowApproval.se Contact a health care provider if: Your symptoms get worse. You have a fever. Get help right away if: You have chills. You feel light-headed or feel like you may faint. You cannot urinate. You have blood or blood clots in your urine. Summary Prostatitis is swelling or inflammation of the prostate gland. Treatment for this condition depends on the type of prostatitis. Take over-the-counter and prescription medicines only as told by your health care provider. Get help right away of you have chills, feel light-headed, feel like you may faint, cannot urinate, or have blood or blood clots in your urine. This information is not intended to replace advice given to you by your health care provider. Make sure you discuss any questions you have with your health care provider. Document Revised: 12/03/2019 Document Reviewed: 12/03/2019 Elsevier Patient Education  2023 [employer].

## 2022-06-25 NOTE — Progress Notes (Signed)
   06/25/2022 12:50 PM   George Harrington February 01, 1971 [id number]  Reason for visit: Lower urinary tract symptoms, possible prostatitis  HPI: 51 year old male with a long history of BPH on Flomax who has had worsening urinary symptoms over the last 4 to 5 months.  He had some mild to moderate improvement after a course of doxycycline previously.  He drinks a fair amount of coffee during the day.  PSA has been normal at 1.4.  He underwent cystoscopy in June 2023 which showed a short and nonobstructive appearing prostate, and we opted to focus on some bladder irritants prior to considering any more invasive treatments.  He made an appointment as he has had some worsening symptoms over the last week.  He felt like he had some pressure in the rectum and discomfort with sitting, as well as some low-grade fevers of 99 over the weekend.  He actually feels better today.  Urinalysis today is benign and PVR is normal at 63ml.  We reviewed possible etiologies including acute vs chronic prostatitis, BPH, overactive bladder, interstitial cystitis, and pelvic floor dysfunction.  With his rectal and perineal pain and low-grade fever, as well as some improvement with a short course of antibiotics previously, I think it is reasonable to try a longer 1 month course of Bactrim for possible prostatitis.  Risk and benefits discussed extensively, and return precautions discussed.  RTC 4 months symptom check, sooner if problems   Sondra Come, MD  [facility] [address], [address] [place], [place] [address] [phone]

## 2022-07-31 ENCOUNTER — Ambulatory Visit: Payer: Managed Care, Other (non HMO) | Admitting: Urology

## 2022-08-01 ENCOUNTER — Telehealth: Payer: Managed Care, Other (non HMO) | Admitting: Physician Assistant

## 2022-08-01 ENCOUNTER — Other Ambulatory Visit: Payer: Self-pay | Admitting: Urology

## 2022-08-01 DIAGNOSIS — K6289 Other specified diseases of anus and rectum: Secondary | ICD-10-CM

## 2022-08-01 NOTE — Progress Notes (Signed)
E-Visit for Urinary Problems  Based on what you shared with me, I feel your condition warrants further evaluation and I recommend that you be seen for a face to face office visit.  Male bladder infections are not very common.  We worry about prostate or kidney conditions.  The standard of care is to examine the abdomen and kidneys, and to do a urine and blood test to make sure that something more serious is not going on.  We recommend that you see a provider today.  Giving recent history you need to either reach out to your Urologist to see if they will extend antibiotic course or want to see you, or you need to be evaluated in person with PCP or at local Urgent Care.    NOTE: You will not be charged for this e-visit.  If you are having a true medical emergency please call 911.       For an urgent face to face visit, [facility] has six urgent care centers for your convenience:     [facility] [place] [facility] [place] Get Driving Directions [phone] [place], [place] [address]    [facility] [place] [facility]) Get Driving Directions [phone] [place], [place] [address]  [facility] [place] ([place]) Get Driving Directions [phone] [address] [place],  [place]  [address]  [facility] Get Driving Directions [phone] [place], [place], [place] [address]   [facility] Get Driving Directions  [phone] [address] [place], [place] [address]   [facility] [place] Get Driving Directions [phone] [address] [place],  [address]  Your MyChart E-visit questionnaire answers were reviewed by a board certified advanced clinical practitioner to complete your personal care plan based on your specific symptoms.  Thank you for  using e-Visits.

## 2022-10-22 ENCOUNTER — Other Ambulatory Visit
Admission: RE | Admit: 2022-10-22 | Discharge: 2022-10-22 | Disposition: A | Discharge status: Home / Self Care | Payer: Managed Care, Other (non HMO) | Attending: Urology | Admitting: Urology

## 2022-10-22 ENCOUNTER — Other Ambulatory Visit: Payer: Self-pay

## 2022-10-22 ENCOUNTER — Ambulatory Visit (INDEPENDENT_AMBULATORY_CARE_PROVIDER_SITE_OTHER): Payer: Managed Care, Other (non HMO) | Admitting: Urology

## 2022-10-22 ENCOUNTER — Encounter: Payer: Self-pay | Admitting: Urology

## 2022-10-22 VITALS — BP 148/93 | HR 87 | Ht 68.0 in | Wt 246.0 lb

## 2022-10-22 DIAGNOSIS — R399 Unspecified symptoms and signs involving the genitourinary system: Secondary | ICD-10-CM | POA: Diagnosis not present

## 2022-10-22 DIAGNOSIS — R300 Dysuria: Secondary | ICD-10-CM | POA: Diagnosis not present

## 2022-10-22 LAB — URINALYSIS, COMPLETE (UACMP) WITH MICROSCOPIC
Glucose, UA: NEGATIVE mg/dL
Hgb urine dipstick: NEGATIVE
Ketones, ur: 15 mg/dL — AB
Leukocytes,Ua: NEGATIVE
Nitrite: NEGATIVE
Protein, ur: NEGATIVE mg/dL
Specific Gravity, Urine: 1.02 (ref 1.005–1.030)
pH: 5.5 (ref 5.0–8.0)

## 2022-10-22 LAB — BLADDER SCAN AMB NON-IMAGING

## 2022-10-22 MED ORDER — TAMSULOSIN HCL 0.4 MG PO CAPS: 0.4000 mg | ORAL_CAPSULE | Freq: Every day | ORAL | 3 refills | Status: DC

## 2022-10-22 NOTE — Patient Instructions (Signed)
Pelvic Floor Dysfunction, Male     Pelvic floor dysfunction (PFD) is a condition that results when the group of muscles and connective tissues that support the organs in the pelvis (pelvic floor muscles) do not work well. These muscles and their connections form a sling that supports the colon and bladder. In men, these muscles also support the prostate gland. PFD causes pelvic floor muscles to be too weak, too tight, or both. In PFD, muscle movements are not coordinated. This may cause bowel or bladder problems. It may also cause pain. What are the causes? This condition may be caused by an injury to the pelvic area or by a weakening of pelvic muscles. In many cases, the exact cause is not known. What increases the risk? The following factors may make you more likely to develop PFD: Having chronic bladder tissue inflammation (interstitial cystitis). Being an older person. Being overweight. History of radiation treatment for cancer in the pelvic region. Previous pelvic surgery, such as removal of the prostate gland (prostatectomy). What are the signs or symptoms? Symptoms of this condition vary and may include: Bladder symptoms, such as: Trouble starting urination and emptying the bladder. Frequent urinary tract infections. Leaking urine when coughing, laughing, or exercising (stress incontinence). Having to pass urine urgently or frequently. Pain when passing urine. Bowel symptoms, such as: Constipation. Urgent or frequent bowel movements. Incomplete bowel movements. Painful bowel movements. Leaking stool or gas. Unexplained genital or rectal pain. Genital or rectal muscle spasms. Low back pain. Sexual dysfunction, such as erectile dysfunction, premature ejaculation, or pain during or after sexual activity. How is this diagnosed? This condition is diagnosed based on: Your symptoms and medical history. A physical exam. During the exam, your health care provider may check your  pelvic muscles for tightness, spasm, pain, or weakness. This may include a rectal exam. In some cases, you may have diagnostic tests, such as: Electrical muscle function tests. Urine flow testing. X-ray tests of bowel function. Ultrasound of the pelvic organs. How is this treated? Treatment for this condition depends on your symptoms. Treatment options include: Physical therapy. This may include Kegel exercises to help relax or strengthen the pelvic floor muscles. Biofeedback. This type of therapy provides feedback on how tight your pelvic floor muscles are so that you can learn to control them. Massage therapy. A treatment that involves electrical stimulation of the pelvic floor muscles to help control pain (transcutaneous electrical nerve stimulation, or TENS). Sound wave therapy (ultrasound) to reduce muscle spasms. Medicines, such as: Muscle relaxants. Bladder control medicines. Surgery to reconstruct or support pelvic floor muscles may be an option if other treatments do not help. Follow these instructions at home: Activity Do your usual activities as told by your health care provider. Ask your health care provider if you should modify any activities. Do pelvic floor strengthening or relaxing exercises at home as told by your physical therapist. Lifestyle Maintain a healthy weight. Eat foods that are high in fiber, such as beans, whole grains, and fresh fruits and vegetables. Limit foods that are high in fat and processed sugars, such as fried or sweet foods. Manage stress with relaxation techniques such as yoga or meditation. General instructions If you have problems with leakage: Use absorbable pads or wear padded underwear. Wash your genital and anal area frequently with mild soap. Keep your genital and anal area as clean and dry as possible. Ask your health care provider if you should try a barrier cream to prevent skin irritation. Take warm baths   to relieve pelvic muscle  tension or spasms. Take over-the-counter and prescription medicines only as told by your health care provider. Keep all follow-up visits. How is this prevented? The cause of PFD is not always known, but there are a few things you can do to reduce the risk of developing this condition, including: Staying at a healthy weight. Getting regular exercise. Managing stress. Contact a health care provider if: Your symptoms are not improving with home care. You have signs or symptoms of PFD that get worse. You develop new signs or symptoms. You have signs of a urinary tract infection, such as: Fever. Chills. Increased urinary frequency. A burning feeling when urinating. You have not had a bowel movement in 3 days (constipation). Summary Pelvic floor dysfunction results when the muscles and connective tissues in your pelvic floor do not work well. These muscles and their connections form a sling that supports your colon and bladder. In men, these muscles also support the prostate gland. PFD may be caused by an injury to the pelvic area or by a weakening of pelvic muscles. PFD causes pelvic floor muscles to be too weak, too tight, or a combination of both. Symptoms may vary from person to person. In most cases, PFD can be treated with physical therapies and medicines. Surgery may be an option if other treatments do not help. This information is not intended to replace advice given to you by your health care provider. Make sure you discuss any questions you have with your health care provider. Document Revised: 03/07/2021 Document Reviewed: 03/07/2021 Elsevier Patient Education  2023 [employer] Inc[employer]    

## 2022-10-22 NOTE — Progress Notes (Signed)
   10/22/2022 9:33 AM   George Harrington [birth year] [id number]  Reason for visit: Lower urinary tract symptoms  HPI: 51 year old male with a long history of lower urinary tract symptoms/urethral irritation.  He has been on Flomax long-term.  When I last saw him in August 2023 we had tried a longer 1 month course of Bactrim for possible prostatitis.  He reports he has improvement while on antibiotics, but his symptoms returned once stopping the antibiotics.  His primary complaint is a " prickly" sensation in his urethra.  He also reports some rectal cramping that is very brief and happens only a few times a year.  He continues to consume a large amount of coffee during the day, as well as bourbon in the evenings before bed.  He does think his symptoms improve when he increases his water intake.  He underwent cystoscopy in June 2023 which was benign and showed a short nonobstructive prostate, and normal urethra and bladder.  PSA has been normal, most recently 1.4 in May 2023.  Urinalysis is again benign today.  Will send for culture and atypicals.  PVR today normal at 0 [name].  Possible etiologies of his symptoms including pelvic floor dysfunction, acute vs chronic prostatitis, BPH, overactive bladder, interstitial cystitis.  -I recommended avoiding bladder irritants like coffee/caffeine and alcohol -Urine sent for culture and atypicals -Offered referral to pelvic floor physical therapy but he deferred -Flomax refilled, could consider a trial off Flomax as well -RTC 1 year symptom check, PVR   Sondra Come, MD  [facility] [address] [place], [place] [address] [phone]

## 2022-10-23 LAB — URINE CULTURE: Culture: <10000 — AB

## 2022-10-29 LAB — MISC LABCORP TEST (SEND OUT): Labcorp test code: 86884

## 2023-06-20 NOTE — Progress Notes (Unsigned)
[facility] [address] [place], [place] [address]  Pulmonary Sleep Medicine   Office Visit Note  Patient Name: George Harrington DOB: [birth year] MRN [id number]    Chief Complaint: Obstructive Sleep Apnea visit  Brief History:  George Harrington is seen today for an annual follow up visit for APAP@ 7-13 cmH2O. The patient has a 1.5 year history of sleep apnea. Patient is using PAP nightly.  The patient feels rested after sleeping with PAP.  The patient reports benefit from PAP use. Reported sleepiness is improved and the Epworth Sleepiness Score is 5 out of 24. The patient does not take naps. The patient complains of the following: oral dryness (advised changing humidity settings). The compliance download shows 96 % compliance with an average use time of 6 hours 42 minutes. The AHI is 1.2. The patient does not complain of limb movements disrupting sleep. The patient continues to require PAP therapy in order to eliminate sleep apnea.   ROS  General: (-) fever, (-) chills, (-) night sweat Nose and Sinuses: (-) nasal stuffiness or itchiness, (-) postnasal drip, (-) nosebleeds, (-) sinus trouble. Mouth and Throat: (-) sore throat, (-) hoarseness. Neck: (-) swollen glands, (-) enlarged thyroid, (-) neck pain. Respiratory: - cough, - shortness of breath, - wheezing. Neurologic: - numbness, - tingling. Psychiatric: - anxiety, - depression   Current Medication: Outpatient Encounter Medications as of 06/23/2023  Medication Sig   hydrochlorothiazide (HYDRODIURIL) 25 MG tablet Take 25 mg by mouth daily.   olmesartan (BENICAR) 20 MG tablet Take 20 mg by mouth daily.   tamsulosin (FLOMAX) 0.4 MG CAPS capsule Take 1 capsule (0.4 mg total) by mouth daily.   No facility-administered encounter medications on file as of 06/23/2023.    Surgical History: Past Surgical History:  Procedure Laterality Date   ACHILLES TENDON REPAIR Left 2019   arm surgery Left 11/11/2002   ARTERY REPAIR  Left    IMPACTED THIRD MOLAR REMOVAL      Medical History: Past Medical History:  Diagnosis Date   BPH (benign prostatic hyperplasia)    Hypertension    LVH (left ventricular hypertrophy)     Family History: Non contributory to the present illness  Social History: Social History   Socioeconomic History   Marital status: Single    Spouse name: Not on file   Number of children: Not on file   Years of education: Not on file   Highest education level: Not on file  Occupational History   Not on file  Tobacco Use   Smoking status: Never   Smokeless tobacco: Former  Advertising account planner   Vaping status: Never Used  Substance and Sexual Activity   Alcohol use: Yes    Alcohol/week: 4.0 standard drinks of alcohol    Types: 4 Shots of liquor per week   Drug use: No   Sexual activity: Yes  Other Topics Concern   Not on file  Social History Narrative   Not on file   Social Determinants of Health   Financial Resource Strain: Not on file  Food Insecurity: Not on file  Transportation Needs: Not on file  Physical Activity: Not on file  Stress: Not on file  Social Connections: Not on file  Intimate Partner Violence: Not on file    Vital Signs: Blood pressure (!) 150/92, pulse 71, resp. rate 16, height 5\' 8"  (1.727 m), weight 240 lb (108.9 kg), SpO2 97%. Body mass index is 36.49 kg/m.    Examination: General Appearance: The patient is  well-developed, well-nourished, and in no distress. Neck Circumference: 44cm Skin: Gross inspection of skin unremarkable. Head: normocephalic, no gross deformities. Eyes: no gross deformities noted. ENT: ears appear grossly normal Neurologic: Alert and oriented. No involuntary movements.  STOP BANG RISK ASSESSMENT S (snore) Have you been told that you snore?     NO   T (tired) Are you often tired, fatigued, or sleepy during the day?   NO  O (obstruction) Do you stop breathing, choke, or gasp during sleep? NO   P (pressure) Do you have or  are you being treated for high blood pressure? YES   B (BMI) Is your body index greater than 35 kg/m? YES   A (age) Are you 18 years old or older? YES   N (neck) Do you have a neck circumference greater than 16 inches?   YES   G (gender) Are you a male? YES   TOTAL STOP/BANG "YES" ANSWERS 5       A STOP-Bang score of 2 or less is considered low risk, and a score of 5 or more is high risk for having either moderate or severe OSA. For people who score 3 or 4, doctors may need to perform further assessment to determine how likely they are to have OSA.         EPWORTH SLEEPINESS SCALE:  Scale:  (0)= no chance of dozing; (1)= slight chance of dozing; (2)= moderate chance of dozing; (3)= high chance of dozing  Chance  Situtation    Sitting and reading: 0    Watching TV: 1    Sitting Inactive in public: 1    As a passenger in car: 1      Lying down to rest: 2    Sitting and talking: 0    Sitting quielty after lunch: 0    In a car, stopped in traffic: 0   TOTAL SCORE:   5 out of 24    SLEEP STUDIES:  HST (12/14/21)  AHI 17.4, REM AHI 53.9, min SPO2 64%   CPAP COMPLIANCE DATA:  Date Range: 06/18/2022-06/17/2023  Average Daily Use: 6 hours 42 minutes  Median Use: 6 hours 51 minutes  Compliance for > 4 Hours: 96%  AHI: 1.2 respiratory events per hour  Days Used: 363/365 days  Mask Leak: 19.3  95th Percentile Pressure: 14.6         LABS: No results found for this or any previous visit (from the past 2160 hour(s)).  Radiology: No results found.  No results found.  No results found.    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 OSA on CPAP The patient does tolerate PAP  and reports  benefit from PAP use. The patient was reminded how to clean equipment and advised to replace supplies routinely. He changed his pressure this past year from 10-20 to 7-13. He is using more pressure though so we will adjust to 7 to 16. The patient was also counselled on weight loss. The compliance is excellent. The AHI is 1.2.   OSA on cpap- controlled. Adjust to APAP 7 to 16 for comfort. Continue with excellent compliance with pap. CPAP continues to be medically necessary to treat this patient's OSA. F/u  one year.     2. CPAP use counseling CPAP Counseling: had a lengthy discussion with the patient regarding the importance of PAP therapy in management of the sleep apnea. Patient appears to understand the risk factor reduction and also understands the risks associated with untreated sleep apnea. Patient will try to make a good faith effort to remain compliant with therapy. Also instructed the patient on proper cleaning of the device including the water must be changed daily if possible and use of distilled water is preferred. Patient understands that the machine should be regularly cleaned with appropriate recommended cleaning solutions that do not damage the PAP machine for example given white vinegar and water rinses. Other methods such as ozone treatment may not be as good as these simple methods to achieve cleaning.   3. Obesity (BMI 30-39.9) Obesity Counseling: Had a lengthy discussion regarding patients BMI and weight issues. Patient was instructed on portion control as well as increased activity. Also discussed caloric restrictions with trying to maintain intake less than 2000 Kcal. Discussions were made in accordance with the 5As of weight management. Simple actions such as not eating late and if able to, taking a walk is suggested.     General Counseling: I have discussed the findings of the evaluation and examination with Cristal Deer.  I have also discussed any further diagnostic  evaluation thatmay be needed or ordered today. Kidus verbalizes understanding of the findings of todays visit. We also reviewed his medications today and discussed drug interactions and side effects including but not limited excessive drowsiness and altered mental states. We also discussed that there is always a risk not just to him but also people around him. he has been encouraged to call the office with any questions or concerns that should arise related to todays visit.  No orders of the defined types were placed in this encounter.       I have personally obtained a history, examined the patient, evaluated laboratory and imaging results, formulated the assessment and plan and placed orders. This patient was seen today by Emmaline Kluver, PA-C in collaboration with Dr. Freda Munro.   Yevonne Pax, MD [facility] Diplomate ABMS Pulmonary Critical Care Medicine and Sleep Medicine

## 2023-06-23 ENCOUNTER — Ambulatory Visit (INDEPENDENT_AMBULATORY_CARE_PROVIDER_SITE_OTHER): Payer: Managed Care, Other (non HMO) | Admitting: Internal Medicine

## 2023-06-23 VITALS — BP 150/92 | HR 71 | Resp 16 | Ht 68.0 in | Wt 240.0 lb

## 2023-06-23 DIAGNOSIS — Z7189 Other specified counseling: Secondary | ICD-10-CM | POA: Diagnosis not present

## 2023-06-23 DIAGNOSIS — G4733 Obstructive sleep apnea (adult) (pediatric): Secondary | ICD-10-CM | POA: Diagnosis not present

## 2023-06-23 DIAGNOSIS — E669 Obesity, unspecified: Secondary | ICD-10-CM | POA: Diagnosis not present

## 2023-06-23 NOTE — Patient Instructions (Signed)

## 2023-10-28 ENCOUNTER — Ambulatory Visit: Payer: Managed Care, Other (non HMO) | Admitting: Urology

## 2023-11-25 ENCOUNTER — Other Ambulatory Visit: Payer: Self-pay

## 2023-11-25 DIAGNOSIS — R399 Unspecified symptoms and signs involving the genitourinary system: Secondary | ICD-10-CM

## 2023-11-25 DIAGNOSIS — R300 Dysuria: Secondary | ICD-10-CM

## 2023-11-25 DIAGNOSIS — N138 Other obstructive and reflux uropathy: Secondary | ICD-10-CM

## 2023-11-26 ENCOUNTER — Ambulatory Visit (INDEPENDENT_AMBULATORY_CARE_PROVIDER_SITE_OTHER): Payer: Managed Care, Other (non HMO) | Admitting: Urology

## 2023-11-26 DIAGNOSIS — N401 Enlarged prostate with lower urinary tract symptoms: Secondary | ICD-10-CM | POA: Diagnosis not present

## 2023-11-26 DIAGNOSIS — R399 Unspecified symptoms and signs involving the genitourinary system: Secondary | ICD-10-CM | POA: Diagnosis not present

## 2023-11-26 DIAGNOSIS — R300 Dysuria: Secondary | ICD-10-CM

## 2023-11-26 DIAGNOSIS — N138 Other obstructive and reflux uropathy: Secondary | ICD-10-CM

## 2023-11-26 LAB — BLADDER SCAN AMB NON-IMAGING

## 2023-11-26 MED ORDER — TAMSULOSIN HCL 0.4 MG PO CAPS: 0.4000 mg | ORAL_CAPSULE | Freq: Every day | ORAL | 3 refills | Status: AC

## 2023-11-26 NOTE — Progress Notes (Signed)
   11/26/2023 9:27 AM   George Harrington Mar 31, 1971 [id number]  Reason for visit: Lower urinary tract symptoms  HPI: 53 year old male with a long history of lower urinary tract symptoms/urethral irritation.  He has been on Flomax  long-term.  Previously has tried longer 1 month course of antibiotics with no major change in his symptoms.  Primary symptom is a " prickly" sensation in the urethra and occasional pelvic pressure.  Symptoms are typically exacerbated by caffeine and alcohol intake, and improved with water intake.He underwent cystoscopy in June 2023 which was benign and showed a short non-obstructive prostate, and normal urethra and bladder.  PSA has been normal, most recently 1.4 in May 2023.  PVR today normal at 0ml.  He started taking over-the-counter saw palmetto in November 2024 and feels this has made a significant improvement in his symptoms.  He continues to consume large volumes of coffee and bourbon in the evening which likely are a large contributor to his urinary symptoms.  Behavioral strategies discussed, reassurance provided regarding normal PSA, urinalysis, PVRs, and cystoscopy.  -I recommended avoiding bladder irritants like coffee/caffeine and alcohol -Flomax  refilled.  Can be filled by PCP moving forward -Follow-up with urology as needed   George Pressman, MD  [facility] [address] [place], [place] [address] [phone]

## 2024-06-11 NOTE — Progress Notes (Signed)
 [facility] [address] [place], [place] [address]  Pulmonary Sleep Medicine   Office Visit Note  Patient Name: George Harrington DOB: [birth year] MRN [id number]    Chief Complaint: Obstructive Sleep Apnea visit  Brief History:  Kimmie is seen today for an annual follow up visit for APAP@ 7-13 cmH2O. The patient has a 2.5 year history of sleep apnea. Patient is using PAP nightly.  The patient feels rested after sleeping with PAP.  The patient reports benefiting from PAP use. Reported sleepiness is  improved and the Epworth Sleepiness Score is 4 out of 24. The patient will rarely take naps. The patient complains of the following: none.  The compliance download shows 96% compliance with an average use time of 6 hours 47 minutes. The AHI is 1.7.  The patient does not complain of limb movements disrupting sleep. The patient continues to require PAP therapy in order to eliminate sleep apnea.   ROS  General: (-) fever, (-) chills, (-) night sweat Nose and Sinuses: (-) nasal stuffiness or itchiness, (-) postnasal drip, (-) nosebleeds, (-) sinus trouble. Mouth and Throat: (-) sore throat, (-) hoarseness. Neck: (-) swollen glands, (-) enlarged thyroid, (-) neck pain. Respiratory: - cough, - shortness of breath, - wheezing. Neurologic: - numbness, - tingling. Psychiatric: - anxiety, - depression   Current Medication: Outpatient Encounter Medications as of 06/14/2024  Medication Sig   allopurinol (ZYLOPRIM) 100 MG tablet Take 100 mg by mouth daily.   hydrochlorothiazide (HYDRODIURIL) 25 MG tablet Take 25 mg by mouth daily.   naproxen (NAPROSYN) 500 MG tablet Take 500 mg by mouth every 12 (twelve) hours.   tamsulosin  (FLOMAX ) 0.4 MG CAPS capsule Take 1 capsule (0.4 mg total) by mouth daily.   [DISCONTINUED] predniSONE (DELTASONE) 10 MG tablet 5 tablets once a day for 3 days, 4 tablets once a day for 3 days, 3 tablets once a day for 3 days, 2 tablets once a day for 2 days,  1 tablet once a day for 2 days Orally for 13 days   No facility-administered encounter medications on file as of 06/14/2024.    Surgical History: Past Surgical History:  Procedure Laterality Date   ACHILLES TENDON REPAIR Left 2019   arm surgery Left 11/11/2002   ARTERY REPAIR Left    IMPACTED THIRD MOLAR REMOVAL      Medical History: Past Medical History:  Diagnosis Date   BPH (benign prostatic hyperplasia)    Hypertension    LVH (left ventricular hypertrophy)     Family History: Non contributory to the present illness  Social History: Social History   Socioeconomic History   Marital status: Single    Spouse name: Not on file   Number of children: Not on file   Years of education: Not on file   Highest education level: Not on file  Occupational History   Not on file  Tobacco Use   Smoking status: Never   Smokeless tobacco: Former  Advertising account planner   Vaping status: Never Used  Substance and Sexual Activity   Alcohol use: Yes    Alcohol/week: 4.0 standard drinks of alcohol    Types: 4 Shots of liquor per week   Drug use: No   Sexual activity: Yes  Other Topics Concern   Not on file  Social History Narrative   Not on file   Social Drivers of Health   Financial Resource Strain: Not on file  Food Insecurity: Not on file  Transportation Needs: Not on file  Physical Activity: Not on file  Stress: Not on file  Social Connections: Not on file  Intimate Partner Violence: Not on file    Vital Signs: Blood pressure (!) 168/105, pulse 98, resp. rate 16, height 5' 8 (1.727 m), weight 241 lb (109.3 kg), SpO2 98%. Body mass index is 36.64 kg/m.    Examination: General Appearance: The patient is well-developed, well-nourished, and in no distress. Neck Circumference: 44 cm Skin: Gross inspection of skin unremarkable. Head: normocephalic, no gross deformities. Eyes: no gross deformities noted. ENT: ears appear grossly normal Neurologic: Alert and oriented. No  involuntary movements.  STOP BANG RISK ASSESSMENT S (snore) Have you been told that you snore?     NO   T (tired) Are you often tired, fatigued, or sleepy during the day?   NO  O (obstruction) Do you stop breathing, choke, or gasp during sleep? NO   P (pressure) Do you have or are you being treated for high blood pressure? YES   B (BMI) Is your body index greater than 35 kg/m? YES   A (age) Are you 55 years old or older? YES   N (neck) Do you have a neck circumference greater than 16 inches?   YES   G (gender) Are you a male? YES   TOTAL STOP/BANG "YES" ANSWERS 5       A STOP-Bang score of 2 or less is considered low risk, and a score of 5 or more is high risk for having either moderate or severe OSA. For people who score 3 or 4, doctors may need to perform further assessment to determine how likely they are to have OSA.         EPWORTH SLEEPINESS SCALE:  Scale:  (0)= no chance of dozing; (1)= slight chance of dozing; (2)= moderate chance of dozing; (3)= high chance of dozing  Chance  Situtation    Sitting and reading: 1    Watching TV: 1    Sitting Inactive in public: 0    As a passenger in car: 0      Lying down to rest: 1    Sitting and talking: 0    Sitting quielty after lunch: 1    In a car, stopped in traffic: 0   TOTAL SCORE:   4 out of 24    SLEEP STUDIES:  HST (12/14/21)  AHI 17.4, REM AHI 53.9, min SPO2 64%    CPAP COMPLIANCE DATA:  Date Range: 06/12/2023-06/10/2024  Average Daily Use: 6 hours 47 minutes  Median Use: 6 hours 53 minutes  Compliance for > 4 Hours: 96%  AHI: 1.7 respiratory events per hour  Days Used: 364/365 days  Mask Leak: 12  95th Percentile Pressure: 12.8         LABS: No results found for this or any previous visit (from the past 2160 hours).  Radiology: No results found.  No results found.  No results found.    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 OSA on CPAP (Primary) The patient does tolerate PAP and reports  benefit from PAP use. His apneas are controlled but he is topping out his pressure range so we will increase him to 7 to  14. The patient was reminded how to clean equipment and advised to replace supplies routinely. The patient was also counselled on weight loss. The compliance is excellent. The AHI is 1.7.   OSA on cpap- controlled. Change to APAP 7-14. Continue with excellent compliance with pap. CPAP continues to be medically necessary to treat this patient's OSA. F/u one year.    2. CPAP use counseling CPAP Counseling: had a lengthy discussion with the patient regarding the importance of PAP therapy in management of the sleep apnea. Patient appears to understand the risk factor reduction and also understands the risks associated with untreated sleep apnea. Patient will try to make a good faith effort to remain compliant with therapy. Also instructed the patient on proper cleaning of the device including the water must be changed daily if possible and use of distilled water is preferred. Patient understands that the machine should be regularly cleaned with appropriate recommended cleaning solutions that do not damage the PAP machine for example given white vinegar and water rinses. Other methods such as ozone treatment may not be as good as these simple methods to achieve cleaning.   3. Obesity (BMI 30-39.9) Obesity Counseling: Had a lengthy discussion regarding patients BMI and weight issues. Patient was instructed on portion control as well as increased activity. Also discussed caloric restrictions with trying to maintain intake less than 2000 Kcal. Discussions were made in accordance with the 5As  of weight management. Simple actions such as not eating late and if able to, taking a walk is suggested.      General Counseling: I have discussed the findings of the evaluation and examination with Lonni.  I have also discussed any further diagnostic evaluation thatmay be needed or ordered today. Kearney verbalizes understanding of the findings of todays visit. We also reviewed his medications today and discussed drug interactions and side effects including but not limited excessive drowsiness and altered mental states. We also discussed that there is always a risk not just to him but also people around him. he has been encouraged to call the office with any questions or concerns that should arise related to todays visit.  No orders of the defined types were placed in this encounter.       I have personally obtained a history, examined the patient, evaluated laboratory and imaging results, formulated the assessment and plan and placed orders. This patient was seen today by Lauraine Lay, PA-C in collaboration with Dr. Elfreda Bathe.   Elfreda DELENA Bathe, MD [facility] Diplomate ABMS Pulmonary Critical Care Medicine and Sleep Medicine

## 2024-06-14 ENCOUNTER — Ambulatory Visit (INDEPENDENT_AMBULATORY_CARE_PROVIDER_SITE_OTHER): Admitting: Internal Medicine

## 2024-06-14 VITALS — BP 168/105 | HR 98 | Resp 16 | Ht 68.0 in | Wt 241.0 lb

## 2024-06-14 DIAGNOSIS — G4733 Obstructive sleep apnea (adult) (pediatric): Secondary | ICD-10-CM

## 2024-06-14 DIAGNOSIS — Z7189 Other specified counseling: Secondary | ICD-10-CM

## 2024-06-14 DIAGNOSIS — E669 Obesity, unspecified: Secondary | ICD-10-CM | POA: Diagnosis not present

## 2024-06-14 NOTE — Patient Instructions (Signed)

## 2024-12-15 ENCOUNTER — Ambulatory Visit: Admitting: Urology

## 2024-12-15 VITALS — BP 156/105 | HR 85 | Ht 68.0 in | Wt 240.0 lb

## 2024-12-15 DIAGNOSIS — R399 Unspecified symptoms and signs involving the genitourinary system: Secondary | ICD-10-CM

## 2024-12-15 DIAGNOSIS — R7989 Other specified abnormal findings of blood chemistry: Secondary | ICD-10-CM | POA: Diagnosis not present

## 2024-12-15 MED ORDER — CLOMIPHENE CITRATE 50 MG PO TABS: 25.0000 mg | ORAL_TABLET | Freq: Every day | ORAL | 6 refills | Status: AC

## 2024-12-15 NOTE — Progress Notes (Signed)
" ° °  12/15/2024 4:26 PM   George Harrington 1971/07/10 [id number]  Reason for visit: Hypogonadism, lower urinary symptoms  History: Last seen December 2023 for urethral irritation, cystoscopy previously was negative, symptoms exacerbated by caffeine and alcohol, cystoscopy June 2023 benign, PSAs have always been normal.  Saw palmetto over-the-counter improved his symptoms Referred now for reported testosterone levels of 280 and 300 and fatigue  Physical Exam: BP (!) 156/105 (BP Location: Left Arm, Patient Position: Sitting, Cuff Size: Large)   Pulse 85   Ht 5' 8 (1.727 m)   Wt 240 lb (108.9 kg)   SpO2 97%   BMI 36.49 kg/m   Imaging/labs: Morning testosterone 280, he reports he had a level around 300 but that lab was not available to me  Today: Primary complaint is fatigue in the afternoon and decreased energy.  He is compliant with CPAP for sleep apnea No significant urinary symptoms today Reports occasional rectal pain/severe rectal spasm, recommended PCP referral to GI for colonoscopy  Plan:   Hypogonadism: We reviewed the AUA guidelines requiring 2 values below 300 to consider exogenous testosterone placement.  We discussed options including behavioral strategies with weight loss, exercise, adequate sleep, decrease stress, trial of off-label Clomid .  He is interested in trying Clomid  and risks and benefits discussed Trial of Clomid  25 mg daily, RTC 6 to 8 weeks morning testosterone prior   George JAYSON Burnet, MD  [facility] [address], [address] [place], [place] [address] [phone]  "

## 2024-12-15 NOTE — Patient Instructions (Signed)
 Hypogonadism, Male  Male hypogonadism is a condition of having a level of testosterone  that is lower than normal. Testosterone  is a chemical, or hormone, that is made mainly in the testicles. In boys, testosterone  is responsible for the development of male characteristics during puberty. These include: Making the penis bigger. Growing and building the muscles. Growing facial hair. Deepening the voice. In adult men, testosterone  is responsible for maintaining: An interest in sex and the ability to have sex. Muscle mass. Sperm production. Red blood cell production. Bone strength. Testosterone  also gives men energy and a sense of well-being. Testosterone  normally decreases as men age and the testicles make less testosterone . Testosterone  levels can vary from man to man. Not all men will have signs and symptoms of low testosterone . Weight, alcohol use, medicines, and certain medical conditions can affect a man's testosterone  level. What are the causes? This condition is caused by: A natural decrease in testosterone  that occurs as a man grows older. This is the main cause of this condition. Use of medicines, such as antidepressants, steroids, and opioids. Diseases and conditions that affect the testicles or the making of testosterone . These include: Injury or damage to the testicles from trauma, cancer, cancer treatment, or infection. Diabetes. Sleep apnea. Genetic conditions that men are born with. Disease of the pituitary gland. This gland is in the brain. It produces hormones. Obesity. Metabolic syndrome. This is a group of diseases that affect blood pressure, blood sugar, cholesterol, and belly fat. HIV or AIDS. Alcohol abuse. Kidney failure. Other long-term or chronic diseases. What are the signs or symptoms? Common symptoms of this condition include: Loss of interest in sex (low sex drive). Inability to have or maintain an erection (erectile dysfunction). Feeling tired  (fatigue). Mood changes, like irritability or depression. Loss of muscle and body hair. Infertility. Large breasts. Weight gain (obesity). How is this diagnosed? Your health care provider can diagnose hypogonadism based on: Your signs and symptoms. A physical exam to check your testosterone  levels. This includes blood tests. Testosterone  levels can change throughout the day. Levels are highest in the morning. You may need to have repeat blood tests before getting a diagnosis of hypogonadism. Depending on your medical history and test results, your health care provider may also do other tests to find the cause of low testosterone . How is this treated? This condition is treated with testosterone  replacement therapy. Testosterone  can be given by: Injection or through pellets inserted under the skin. Gels or patches placed on the skin or in the mouth. Testosterone  therapy is not for everyone. It has risks and side effects. Your health care provider will consider your medical history, your risk for prostate cancer, your age, and your symptoms before putting you on testosterone  replacement therapy. Follow these instructions at home: Take over-the-counter and prescription medicines only as told by your health care provider. Eat foods that are high in fiber, such as beans, whole grains, and fresh fruits and vegetables. Limit foods that are high in fat and processed sugars, such as fried or sweet foods. If you drink alcohol: Limit how much you have to 0-2 drinks a day. Know how much alcohol is in your drink. In the [place] one drink equals one 12 oz bottle of beer (355 mL), one 5 oz glass of wine (148 mL), or one 1 oz glass of hard liquor (44 mL). Return to your normal activities as told by your health care provider. Ask your health care provider what activities are safe for you. Keep all  follow-up visits. This is important. Contact a health care provider if: You have any of the signs or symptoms of  low testosterone . You have any side effects from testosterone  therapy. Summary Male hypogonadism is a condition of having a level of testosterone  that is lower than normal. The natural drop in testosterone  production that occurs with age is the most common cause of this condition. Low testosterone  can also be caused by many diseases and conditions that affect the testicles and the making of testosterone . This condition is treated with testosterone  replacement therapy. There are risks and side effects of testosterone  therapy. Your health care provider will consider your age, medical history, symptoms, and risks for prostate cancer before putting you on testosterone  therapy. This information is not intended to replace advice given to you by your health care provider. Make sure you discuss any questions you have with your health care provider. Document Revised: 10/06/2023 Document Reviewed: 10/06/2023 [employer] Patient Education  2025 [employer].

## 2025-02-01 ENCOUNTER — Other Ambulatory Visit

## 2025-02-09 ENCOUNTER — Ambulatory Visit: Admitting: Urology
